# Patient Record
Sex: Female | Born: 1990 | Race: Black or African American | Hispanic: No | Marital: Single | Smoking: Never smoker
Health system: Southern US, Community
[De-identification: ages and names within clinical notes are randomized; demographics above are authoritative.]

## PROBLEM LIST (undated history)

## (undated) DIAGNOSIS — D279 Benign neoplasm of unspecified ovary: Secondary | ICD-10-CM

## (undated) DIAGNOSIS — K5000 Crohn's disease of small intestine without complications: Secondary | ICD-10-CM

## (undated) DIAGNOSIS — K2970 Gastritis, unspecified, without bleeding: Secondary | ICD-10-CM

## (undated) HISTORY — PX: ESOPHAGOGASTRODUODENOSCOPY: SHX1529

## (undated) SURGERY — COLONOSCOPY
Anesthesia: Moderate Sedation

---
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
## 2012-08-12 ENCOUNTER — Emergency Department (HOSPITAL_BASED_OUTPATIENT_CLINIC_OR_DEPARTMENT_OTHER): Payer: Managed Care, Other (non HMO)

## 2012-08-12 ENCOUNTER — Emergency Department (HOSPITAL_BASED_OUTPATIENT_CLINIC_OR_DEPARTMENT_OTHER)
Admission: EM | Admit: 2012-08-12 | Discharge: 2012-08-12 | Disposition: A | Discharge status: Home / Self Care | Payer: Managed Care, Other (non HMO) | Attending: Emergency Medicine | Admitting: Emergency Medicine

## 2012-08-12 ENCOUNTER — Encounter (HOSPITAL_BASED_OUTPATIENT_CLINIC_OR_DEPARTMENT_OTHER): Payer: Self-pay | Admitting: *Deleted

## 2012-08-12 DIAGNOSIS — R112 Nausea with vomiting, unspecified: Secondary | ICD-10-CM

## 2012-08-12 DIAGNOSIS — R109 Unspecified abdominal pain: Secondary | ICD-10-CM

## 2012-08-12 DIAGNOSIS — Z9089 Acquired absence of other organs: Secondary | ICD-10-CM | POA: Insufficient documentation

## 2012-08-12 DIAGNOSIS — R1013 Epigastric pain: Secondary | ICD-10-CM | POA: Insufficient documentation

## 2012-08-12 LAB — CBC WITH DIFFERENTIAL/PLATELET
Basophils Absolute: 0 10*3/uL (ref 0.0–0.1)
Basophils Relative: 0 % (ref 0–1)
Eosinophils Absolute: 0.4 10*3/uL (ref 0.0–0.7)
Eosinophils Relative: 3 % (ref 0–5)
HCT: 38 % (ref 36.0–46.0)
Hemoglobin: 13 g/dL (ref 12.0–15.0)
Lymphocytes Relative: 14 % (ref 12–46)
Lymphs Abs: 1.7 10*3/uL (ref 0.7–4.0)
MCH: 25.5 pg — ABNORMAL LOW (ref 26.0–34.0)
MCHC: 34.2 g/dL (ref 30.0–36.0)
MCV: 74.5 fL — ABNORMAL LOW (ref 78.0–100.0)
Monocytes Absolute: 0.8 10*3/uL (ref 0.1–1.0)
Monocytes Relative: 7 % (ref 3–12)
Neutro Abs: 9.1 10*3/uL — ABNORMAL HIGH (ref 1.7–7.7)
Neutrophils Relative %: 76 % (ref 43–77)
Platelets: 368 10*3/uL (ref 150–400)
RBC: 5.1 MIL/uL (ref 3.87–5.11)
RDW: 13.2 % (ref 11.5–15.5)
WBC: 12 10*3/uL — ABNORMAL HIGH (ref 4.0–10.5)

## 2012-08-12 LAB — COMPREHENSIVE METABOLIC PANEL
ALT: 29 U/L (ref 0–35)
AST: 20 U/L (ref 0–37)
Albumin: 3.7 g/dL (ref 3.5–5.2)
Alkaline Phosphatase: 115 U/L (ref 39–117)
BUN: 6 mg/dL (ref 6–23)
CO2: 25 mEq/L (ref 19–32)
Calcium: 9.6 mg/dL (ref 8.4–10.5)
Chloride: 99 mEq/L (ref 96–112)
Creatinine, Ser: 0.7 mg/dL (ref 0.50–1.10)
GFR calc Af Amer: >90 mL/min (ref >90)
GFR calc non Af Amer: >90 mL/min (ref >90)
Glucose, Bld: 89 mg/dL (ref 70–99)
Potassium: 3.5 mEq/L (ref 3.5–5.1)
Sodium: 137 mEq/L (ref 135–145)
Total Bilirubin: 0.5 mg/dL (ref 0.3–1.2)
Total Protein: 8.9 g/dL — ABNORMAL HIGH (ref 6.0–8.3)

## 2012-08-12 LAB — PREGNANCY, URINE: Preg Test, Ur: NEGATIVE

## 2012-08-12 LAB — URINE MICROSCOPIC-ADD ON

## 2012-08-12 LAB — URINALYSIS, ROUTINE W REFLEX MICROSCOPIC
Glucose, UA: NEGATIVE mg/dL
Ketones, ur: 15 mg/dL — AB
Leukocytes, UA: NEGATIVE
Nitrite: NEGATIVE
Protein, ur: NEGATIVE mg/dL
Specific Gravity, Urine: 1.024 (ref 1.005–1.030)
Urobilinogen, UA: 1 mg/dL (ref 0.0–1.0)
pH: 6.5 (ref 5.0–8.0)

## 2012-08-12 LAB — LIPASE, BLOOD: Lipase: 20 U/L (ref 11–59)

## 2012-08-12 MED ORDER — ONDANSETRON HCL 4 MG/2ML IJ SOLN
4.0000 mg | Freq: Once | INTRAMUSCULAR | Status: AC
  Administered 2012-08-12: 4 mg via INTRAVENOUS
  Filled 2012-08-12: qty 2

## 2012-08-12 MED ORDER — PANTOPRAZOLE SODIUM 40 MG IV SOLR
40.0000 mg | Freq: Once | INTRAVENOUS | Status: AC
  Administered 2012-08-12: 40 mg via INTRAVENOUS
  Filled 2012-08-12: qty 40

## 2012-08-12 MED ORDER — ONDANSETRON 8 MG PO TBDP: 8.0000 mg | ORAL_TABLET | Freq: Two times a day (BID) | ORAL | Status: AC | PRN

## 2012-08-12 MED ORDER — FENTANYL CITRATE 0.05 MG/ML IJ SOLN
50.0000 ug | Freq: Once | INTRAMUSCULAR | Status: AC
  Administered 2012-08-12: 50 ug via INTRAVENOUS
  Filled 2012-08-12: qty 2

## 2012-08-12 MED ORDER — ACETAMINOPHEN-CODEINE #3 300-30 MG PO TABS: 1.0000 | ORAL_TABLET | Freq: Four times a day (QID) | ORAL | Status: AC | PRN

## 2012-08-12 MED ORDER — SODIUM CHLORIDE 0.9 % IV BOLUS (SEPSIS)
1000.0000 mL | Freq: Once | INTRAVENOUS | Status: AC
  Administered 2012-08-12: 1000 mL via INTRAVENOUS

## 2012-08-12 MED ORDER — PANTOPRAZOLE SODIUM 40 MG PO TBEC: 40.0000 mg | DELAYED_RELEASE_TABLET | Freq: Every day | ORAL | Status: DC

## 2012-08-12 NOTE — ED Notes (Signed)
Pt c/o abd pain with n/v x 2 days.  

## 2012-08-12 NOTE — ED Provider Notes (Signed)
History     CSN: [id number]  Arrival date & time 08/12/12  1658   First MD Initiated Contact with Patient 08/12/12 1909      Chief Complaint  Patient presents with  . Abdominal Pain    (Consider location/radiation/quality/duration/timing/severity/associated sxs/prior treatment) HPI Comments: Pt with several day history of intermittent upper epigastric burning pain, has had prior cholecystectomy.  Pt reports worse with eating at times, but sometimes occurs on its own.  Has been associated with occasional N/V, no diarrhea.  No fevers, chills, no foreign travel, no obv sick contacts.  Pt denies feeling sig bloated.  No decrease or increase in flatus.  No dysuria, vaginal bleeding or discharge.  No radiation to chest or back.  Has not taken anything in particular for symptoms.  Has a PCP.    The history is provided by the patient.    History reviewed. No pertinent past medical history.  Past Surgical History  Procedure Date  . Cholecystectomy     History reviewed. No pertinent family history.  History  Substance Use Topics  . Smoking status: Never Smoker   . Smokeless tobacco: Not on file  . Alcohol Use: No    OB History    Grav Para Term Preterm Abortions TAB SAB Ect Mult Living                  Review of Systems  Constitutional: Positive for appetite change. Negative for fever and chills.  HENT: Negative for sore throat and trouble swallowing.   Respiratory: Negative for shortness of breath.   Cardiovascular: Negative for chest pain.  Gastrointestinal: Positive for nausea, vomiting and abdominal distention. Negative for diarrhea.  Genitourinary: Negative for dysuria, frequency, flank pain, vaginal bleeding, vaginal discharge and pelvic pain.  Musculoskeletal: Negative for back pain.  All other systems reviewed and are negative.    Allergies  Review of patient's allergies indicates no known allergies.  Home Medications   Current Outpatient Rx  Name Route Sig  Dispense Refill  . DESOGESTREL-ETHINYL ESTRADIOL 0.15-30 MG-MCG PO TABS Oral Take 1 tablet by mouth daily.    . ACETAMINOPHEN-CODEINE #3 300-30 MG PO TABS Oral Take 1-2 tablets by mouth every 6 (six) hours as needed for pain. 15 tablet 0  . ONDANSETRON 8 MG PO TBDP Oral Take 1 tablet (8 mg total) by mouth every 12 (twelve) hours as needed for nausea. 20 tablet 0  . PANTOPRAZOLE SODIUM 40 MG PO TBEC Oral Take 1 tablet (40 mg total) by mouth daily. 14 tablet 0    BP 131/92  Pulse 62  Temp 97.9 F (36.6 C) (Oral)  Resp 16  Ht 5\' 8"  (1.727 m)  Wt 178 lb (80.74 kg)  BMI 27.06 kg/m2  SpO2 100%  LMP 07/29/2012  Physical Exam  Nursing note and vitals reviewed. Constitutional: She is oriented to person, place, and time. She appears well-developed and well-nourished. No distress.  HENT:  Head: Normocephalic and atraumatic.  Eyes: No scleral icterus.  Neck: Normal range of motion.  Cardiovascular: Normal rate and regular rhythm.   Pulmonary/Chest: Effort normal. No respiratory distress. She has no wheezes.  Abdominal: Soft. Normal appearance and bowel sounds are normal. She exhibits no distension. There is no tenderness. There is no rebound, no tenderness at McBurney's point and negative Murphy's sign.    Musculoskeletal: Normal range of motion.  Neurological: She is alert and oriented to person, place, and time.  Skin: Skin is warm. No rash noted.    ED  Course  Procedures (including critical care time)  Labs Reviewed  URINALYSIS, ROUTINE W REFLEX MICROSCOPIC - Abnormal; Notable for the following:    Color, Urine AMBER (*)  BIOCHEMICALS MAY BE AFFECTED BY COLOR   Hgb urine dipstick LARGE (*)     Bilirubin Urine SMALL (*)     Ketones, ur 15 (*)     All other components within normal limits  URINE MICROSCOPIC-ADD ON - Abnormal; Notable for the following:    Squamous Epithelial / LPF FEW (*)     Bacteria, UA MANY (*)     All other components within normal limits  CBC WITH  DIFFERENTIAL - Abnormal; Notable for the following:    WBC 12.0 (*)     MCV 74.5 (*)     MCH 25.5 (*)     Neutro Abs 9.1 (*)     All other components within normal limits  COMPREHENSIVE METABOLIC PANEL - Abnormal; Notable for the following:    Total Protein 8.9 (*)     All other components within normal limits  PREGNANCY, URINE  LIPASE, BLOOD   US Abdomen Complete  08/12/2012  *RADIOLOGY REPORT*  Clinical Data:  Intermittent epigastric pain.  Cholecystectomy in February 2012.  ABDOMINAL ULTRASOUND COMPLETE  Comparison:  Abdominal ultrasound 12/22/2010  Findings:  Gallbladder:  Surgically absent.  Common Bile Duct:  Within normal limits in caliber. Measures 3 mm.  Liver: No focal mass lesion identified.  Within normal limits in parenchymal echogenicity.  IVC:  Appears normal.  Pancreas: Although the pancreas is difficult to visualize in its entirety, no focal pancreatic abnormality is identified.  Spleen:  Within normal limits in size and echotexture. Measures 5 cm in sagittal length.  Right kidney:  Normal in size and parenchymal echogenicity.  No evidence of mass or hydronephrosis.  Left kidney:  Normal in size and parenchymal echogenicity.  No evidence of mass or hydronephrosis. Imaging is limited by bowel gas, which partially obscures the kidney.  Abdominal Aorta:  No aneurysm identified.  IMPRESSION:  1. Negative abdominal ultrasound. Slight limitation by bowel gas. 2. Cholecystectomy.   Original Report Authenticated By: Britta Mccreedy, M.D.      1. Abdominal pain   2. Nausea and vomiting       MDM  Pt felt improved after IV meds.  Soft abd.  U/S neg.  Labs ok, pt reassured, urged to take antiemetics, bland diet, PPI.  Follow up with GI if not improving in the next 1-2 weeks.  Pt and family agreeable.  Return instructions provided.       Gavin Pound. Oletta Lamas, MD 08/14/12 [id number]

## 2012-08-29 ENCOUNTER — Encounter (HOSPITAL_COMMUNITY): Payer: Self-pay | Admitting: *Deleted

## 2012-08-29 ENCOUNTER — Emergency Department (HOSPITAL_COMMUNITY)
Admission: EM | Admit: 2012-08-29 | Discharge: 2012-08-29 | Disposition: A | Discharge status: Home / Self Care | Payer: Managed Care, Other (non HMO) | Attending: Emergency Medicine | Admitting: Emergency Medicine

## 2012-08-29 DIAGNOSIS — R1013 Epigastric pain: Secondary | ICD-10-CM | POA: Insufficient documentation

## 2012-08-29 LAB — URINALYSIS, ROUTINE W REFLEX MICROSCOPIC
Bilirubin Urine: NEGATIVE
Glucose, UA: NEGATIVE mg/dL
Hgb urine dipstick: NEGATIVE
Ketones, ur: NEGATIVE mg/dL
Protein, ur: NEGATIVE mg/dL
Urobilinogen, UA: 0.2 mg/dL (ref 0.0–1.0)

## 2012-08-29 LAB — BASIC METABOLIC PANEL
BUN: 6 mg/dL (ref 6–23)
CO2: 23 mEq/L (ref 19–32)
Calcium: 9.4 mg/dL (ref 8.4–10.5)
GFR calc non Af Amer: >90 mL/min (ref >90)
Glucose, Bld: 135 mg/dL — ABNORMAL HIGH (ref 70–99)
Potassium: 3.2 mEq/L — ABNORMAL LOW (ref 3.5–5.1)
Sodium: 137 mEq/L (ref 135–145)

## 2012-08-29 LAB — CBC WITH DIFFERENTIAL/PLATELET
Basophils Relative: 0 % (ref 0–1)
Eosinophils Absolute: 0.4 10*3/uL (ref 0.0–0.7)
Eosinophils Relative: 2 % (ref 0–5)
Hemoglobin: 11.3 g/dL — ABNORMAL LOW (ref 12.0–15.0)
Lymphs Abs: 2.3 10*3/uL (ref 0.7–4.0)
MCH: 25.1 pg — ABNORMAL LOW (ref 26.0–34.0)
MCHC: 33.2 g/dL (ref 30.0–36.0)
MCV: 75.4 fL — ABNORMAL LOW (ref 78.0–100.0)
Monocytes Relative: 8 % (ref 3–12)
Neutrophils Relative %: 78 % — ABNORMAL HIGH (ref 43–77)
RBC: 4.51 MIL/uL (ref 3.87–5.11)

## 2012-08-29 LAB — URINE MICROSCOPIC-ADD ON

## 2012-08-29 MED ORDER — PROMETHAZINE HCL 25 MG PO TABS: 25.0000 mg | ORAL_TABLET | Freq: Four times a day (QID) | ORAL | Status: DC | PRN

## 2012-08-29 MED ORDER — GI COCKTAIL ~~LOC~~
30.0000 mL | Freq: Once | ORAL | Status: AC
  Administered 2012-08-29: 30 mL via ORAL
  Filled 2012-08-29: qty 30

## 2012-08-29 MED ORDER — SODIUM CHLORIDE 0.9 % IV BOLUS (SEPSIS)
1000.0000 mL | Freq: Once | INTRAVENOUS | Status: AC
  Administered 2012-08-29: 1000 mL via INTRAVENOUS

## 2012-08-29 MED ORDER — HYDROMORPHONE HCL PF 1 MG/ML IJ SOLN
1.0000 mg | Freq: Once | INTRAMUSCULAR | Status: AC
  Administered 2012-08-29: 1 mg via INTRAVENOUS
  Filled 2012-08-29: qty 1

## 2012-08-29 MED ORDER — OXYCODONE-ACETAMINOPHEN 5-325 MG PO TABS: 2.0000 | ORAL_TABLET | ORAL | Status: DC | PRN

## 2012-08-29 MED ORDER — ONDANSETRON HCL 4 MG/2ML IJ SOLN
4.0000 mg | Freq: Once | INTRAMUSCULAR | Status: AC
  Administered 2012-08-29: 4 mg via INTRAVENOUS
  Filled 2012-08-29: qty 2

## 2012-08-29 NOTE — ED Notes (Addendum)
C/o abd pain, onset 0300, also nausea, no relief with codeine (old previous Rx), last ate ~ 8hrs ago, last BM sunday (normal), (denies: back pain, nvd, fever, vaginal or urinary sx or bleeding). Pain constant, but intensity fluctuates, rates currently a 4/10, but increases to 10/10, pt mildly restless, parents at [facility].

## 2012-08-29 NOTE — ED Provider Notes (Signed)
Pt well appearing and in no distress Her abdomen is soft and no focal tenderness She admits this is something she has had previously, she has seen GI and she reports she is supposed to have EGD in the future She has no lower abdominal tenderness Labs pending and will require reassessment (heart rate has improved) BP 133/81  Pulse 108  Temp 98.3 F (36.8 C)  Resp 25  SpO2 99%  LMP 07/29/2012   Joya Gaskins, MD 08/29/12 [phone]

## 2012-08-29 NOTE — ED Provider Notes (Signed)
History     CSN: [id number]  Arrival date & time 08/29/12  8295   First MD Initiated Contact with Patient 08/29/12 0617      Chief Complaint  Patient presents with  . Abdominal Pain    (Consider location/radiation/quality/duration/timing/severity/associated sxs/prior treatment) HPI Comments: Patient presents with sudden onset epigastric pain starting 3 hours ago that woke her from sleep. She reports a severe pressure sensation that is constantly and intermittently becomes intolerable. Patient reports taking codeine last night which was the last thing she consumed before feeling abdominal pain. She has taken codeine before without difficulty. She reports a history of epigastric pain prior to this episode, for which she is seeing Sarah Morrison with GI. She admits to associated nausea. She denies fever, vomiting, diarrhea, headache.   Patient is a 21 y.o. female presenting with abdominal pain.  Abdominal Pain The primary symptoms of the illness include abdominal pain and nausea.    History reviewed. No pertinent past medical history.  Past Surgical History  Procedure Date  . Cholecystectomy     No family history on file.  History  Substance Use Topics  . Smoking status: Never Smoker   . Smokeless tobacco: Not on file  . Alcohol Use: No    OB History    Grav Para Term Preterm Abortions TAB SAB Ect Mult Living                  Review of Systems  Gastrointestinal: Positive for nausea and abdominal pain.  All other systems reviewed and are negative.    Allergies  Review of patient's allergies indicates no known allergies.  Home Medications   Current Outpatient Rx  Name Route Sig Dispense Refill  . DESOGESTREL-ETHINYL ESTRADIOL 0.15-30 MG-MCG PO TABS Oral Take 1 tablet by mouth daily.    Marland Kitchen PANTOPRAZOLE SODIUM 40 MG PO TBEC Oral Take 1 tablet (40 mg total) by mouth daily. 14 tablet 0    BP 133/81  Pulse 108  Temp 98.3 F (36.8 C)  Resp 25  SpO2 99%  LMP  07/29/2012  Physical Exam  Nursing note and vitals reviewed. Constitutional: She is oriented to person, place, and time. She appears well-developed and well-nourished. No distress.       Patient appears extremely uncomfortable.   HENT:  Head: Normocephalic and atraumatic.  Eyes: Conjunctivae normal are normal. No scleral icterus.  Neck: Normal range of motion. Neck supple.  Cardiovascular: Regular rhythm.  Exam reveals no gallop and no friction rub.   No murmur heard.      Tachycardic in 130's.  Pulmonary/Chest: Effort normal. No respiratory distress. She has no wheezes. She has no rales. She exhibits no tenderness.  Abdominal: Soft. She exhibits no distension. There is tenderness. There is guarding. There is no rebound.       Tenderness to light palpation of epigastrium.   Musculoskeletal: Normal range of motion.  Neurological: She is alert and oriented to person, place, and time. Coordination normal.  Skin: Skin is warm and dry. She is not diaphoretic.  Psychiatric: She has a normal mood and affect. Her behavior is normal.    ED Course  Procedures (including critical care time)  Labs Reviewed  BASIC METABOLIC PANEL - Abnormal; Notable for the following:    Potassium 3.2 (*)     Glucose, Bld 135 (*)     All other components within normal limits  CBC WITH DIFFERENTIAL - Abnormal; Notable for the following:    WBC 18.6 (*)  Hemoglobin 11.3 (*)     HCT 34.0 (*)     MCV 75.4 (*)     MCH 25.1 (*)     Neutrophils Relative 78 (*)     Neutro Abs 14.5 (*)     Monocytes Absolute 1.5 (*)     All other components within normal limits  URINALYSIS, ROUTINE W REFLEX MICROSCOPIC - Abnormal; Notable for the following:    APPearance HAZY (*)     Leukocytes, UA TRACE (*)     All other components within normal limits  URINE MICROSCOPIC-ADD ON - Abnormal; Notable for the following:    Squamous Epithelial / LPF MANY (*)     Bacteria, UA FEW (*)     All other components within normal  limits  POCT PREGNANCY, URINE   No results found.   1. Epigastric abdominal pain       MDM  6:36 AM Patient seems to be in a significant amount of pain. She will receive 2mg  dilaudid, zofran, and a GI cocktail.   8:12 AM Patient feeling much better. No longer tachycardic. She will try drinking water and eating saltines. If she tolerates PO, she can be discharged. She has a follow up scheduled with GI for an EGD in 1 week.   9:08 AM Patient tolerating PO. She will call Sarah Morrison to move her appointment up. I will discharge her with pain medication and phenergan. She should return with worsening or concerning symptoms.     Emilia Beck, PA-C 09/06/12 0101

## 2012-09-10 NOTE — ED Provider Notes (Signed)
Medical screening examination/treatment/procedure(s) were performed by non-physician practitioner and as supervising physician I was immediately available for consultation/collaboration.  Sunnie Nielsen, MD 09/10/12 2255

## 2012-09-27 ENCOUNTER — Encounter: Payer: Self-pay | Admitting: Gastroenterology

## 2012-10-03 ENCOUNTER — Telehealth: Payer: Self-pay | Admitting: Gastroenterology

## 2012-10-03 NOTE — Telephone Encounter (Signed)
Patient has been seeing Dr. [name] at [facility] and had an EGD at the beginning of the month according to the patient's mother.  They want her to come to [place].  She has abdominal pain and weight loss and has 2 ER admissions for the same in October.  The patient's mother is advised that she will need to get the records from Dr. Marge Duncans office prior to seeing the patient.  She is advised that I will leave the appt for 10/24/12 for now and have the records reviewed by Dr. Russella Dar when they arrive (they request Dr. Russella Dar).  She is advised that he will need to agree to see her as a patient and we will call once the records are here.  She is advised that we will not be able to see her at all without the records.

## 2012-10-04 NOTE — Telephone Encounter (Signed)
Some records received from [facility].  I have called Jerene Dilling from the referring office.  She is to send me EGD and path from Dr. [name] and records from Dr. Chestine Spore from 09.

## 2012-10-05 NOTE — Telephone Encounter (Signed)
Records are on Dr. Ardell Isaacs desk for review

## 2012-10-11 ENCOUNTER — Observation Stay (HOSPITAL_COMMUNITY): Payer: Managed Care, Other (non HMO)

## 2012-10-11 ENCOUNTER — Observation Stay (HOSPITAL_COMMUNITY)
Admission: EM | Admit: 2012-10-11 | Discharge: 2012-10-13 | Disposition: A | Discharge status: Home / Self Care | Payer: Managed Care, Other (non HMO) | Attending: Family Medicine | Admitting: Family Medicine

## 2012-10-11 ENCOUNTER — Encounter (HOSPITAL_COMMUNITY): Payer: Self-pay | Admitting: *Deleted

## 2012-10-11 ENCOUNTER — Emergency Department (HOSPITAL_COMMUNITY): Payer: Managed Care, Other (non HMO)

## 2012-10-11 DIAGNOSIS — K5289 Other specified noninfective gastroenteritis and colitis: Secondary | ICD-10-CM | POA: Insufficient documentation

## 2012-10-11 DIAGNOSIS — K5000 Crohn's disease of small intestine without complications: Secondary | ICD-10-CM

## 2012-10-11 DIAGNOSIS — R1031 Right lower quadrant pain: Principal | ICD-10-CM | POA: Insufficient documentation

## 2012-10-11 DIAGNOSIS — D279 Benign neoplasm of unspecified ovary: Secondary | ICD-10-CM

## 2012-10-11 DIAGNOSIS — IMO0001 Reserved for inherently not codable concepts without codable children: Secondary | ICD-10-CM | POA: Diagnosis present

## 2012-10-11 DIAGNOSIS — R300 Dysuria: Secondary | ICD-10-CM | POA: Insufficient documentation

## 2012-10-11 DIAGNOSIS — K219 Gastro-esophageal reflux disease without esophagitis: Secondary | ICD-10-CM | POA: Insufficient documentation

## 2012-10-11 HISTORY — DX: Benign neoplasm of unspecified ovary: D27.9

## 2012-10-11 HISTORY — DX: Crohn's disease of small intestine without complications: K50.00

## 2012-10-11 HISTORY — DX: Gastritis, unspecified, without bleeding: K29.70

## 2012-10-11 LAB — CBC WITH DIFFERENTIAL/PLATELET
Basophils Absolute: 0 10*3/uL (ref 0.0–0.1)
HCT: 33.2 % — ABNORMAL LOW (ref 36.0–46.0)
Hemoglobin: 11 g/dL — ABNORMAL LOW (ref 12.0–15.0)
Lymphocytes Relative: 13 % (ref 12–46)
Lymphs Abs: 1.6 10*3/uL (ref 0.7–4.0)
MCV: 73.8 fL — ABNORMAL LOW (ref 78.0–100.0)
Monocytes Absolute: 0.6 10*3/uL (ref 0.1–1.0)
Monocytes Relative: 5 % (ref 3–12)
Neutro Abs: 9.8 10*3/uL — ABNORMAL HIGH (ref 1.7–7.7)
RBC: 4.5 MIL/uL (ref 3.87–5.11)
RDW: 12.4 % (ref 11.5–15.5)
WBC: 12.4 10*3/uL — ABNORMAL HIGH (ref 4.0–10.5)

## 2012-10-11 LAB — COMPREHENSIVE METABOLIC PANEL WITH GFR
ALT: 24 U/L (ref 0–35)
AST: 18 U/L (ref 0–37)
Albumin: 2.7 g/dL — ABNORMAL LOW (ref 3.5–5.2)
Alkaline Phosphatase: 126 U/L — ABNORMAL HIGH (ref 39–117)
BUN: 4 mg/dL — ABNORMAL LOW (ref 6–23)
CO2: 23 meq/L (ref 19–32)
Calcium: 9.3 mg/dL (ref 8.4–10.5)
Chloride: 100 meq/L (ref 96–112)
Creatinine, Ser: 0.62 mg/dL (ref 0.50–1.10)
GFR calc Af Amer: >90 mL/min (ref >90)
GFR calc non Af Amer: >90 mL/min (ref >90)
Glucose, Bld: 106 mg/dL — ABNORMAL HIGH (ref 70–99)
Potassium: 3.6 meq/L (ref 3.5–5.1)
Sodium: 137 meq/L (ref 135–145)
Total Bilirubin: 0.4 mg/dL (ref 0.3–1.2)
Total Protein: 8.2 g/dL (ref 6.0–8.3)

## 2012-10-11 LAB — HEPATIC FUNCTION PANEL
ALT: 25 U/L (ref 0–35)
Indirect Bilirubin: 0.3 mg/dL (ref 0.3–0.9)
Total Protein: 7.3 g/dL (ref 6.0–8.3)

## 2012-10-11 LAB — URINALYSIS, ROUTINE W REFLEX MICROSCOPIC
Glucose, UA: NEGATIVE mg/dL
Hgb urine dipstick: NEGATIVE
Ketones, ur: NEGATIVE mg/dL
Protein, ur: NEGATIVE mg/dL

## 2012-10-11 LAB — C-REACTIVE PROTEIN: CRP: 7.1 mg/dL — ABNORMAL HIGH (ref <0.60)

## 2012-10-11 MED ORDER — ONDANSETRON HCL 4 MG/2ML IJ SOLN
4.0000 mg | Freq: Once | INTRAMUSCULAR | Status: AC
  Administered 2012-10-11: 4 mg via INTRAVENOUS
  Filled 2012-10-11: qty 2

## 2012-10-11 MED ORDER — ALUM & MAG HYDROXIDE-SIMETH 200-200-20 MG/5ML PO SUSP: 30.0000 mL | Freq: Four times a day (QID) | ORAL | Status: DC | PRN

## 2012-10-11 MED ORDER — IOHEXOL 300 MG/ML  SOLN
20.0000 mL | INTRAMUSCULAR | Status: AC
  Administered 2012-10-11 (×2): 20 mL via ORAL

## 2012-10-11 MED ORDER — IOHEXOL 300 MG/ML  SOLN
80.0000 mL | Freq: Once | INTRAMUSCULAR | Status: AC | PRN
  Administered 2012-10-11: 80 mL via INTRAVENOUS

## 2012-10-11 MED ORDER — PEG 3350-KCL-NA BICARB-NACL 420 G PO SOLR
4000.0000 mL | Freq: Once | ORAL | Status: DC
  Filled 2012-10-11: qty 4000

## 2012-10-11 MED ORDER — CIPROFLOXACIN IN D5W 400 MG/200ML IV SOLN
400.0000 mg | Freq: Two times a day (BID) | INTRAVENOUS | Status: DC
Start: 2012-10-11 — End: 2012-10-13
  Administered 2012-10-11 – 2012-10-13 (×4): 400 mg via INTRAVENOUS
  Filled 2012-10-11 (×6): qty 200

## 2012-10-11 MED ORDER — DIPHENHYDRAMINE HCL 50 MG/ML IJ SOLN: 12.5000 mg | Freq: Four times a day (QID) | INTRAMUSCULAR | Status: DC | PRN

## 2012-10-11 MED ORDER — LACTATED RINGERS IV BOLUS (SEPSIS): 1000.0000 mL | Freq: Three times a day (TID) | INTRAVENOUS | Status: AC | PRN

## 2012-10-11 MED ORDER — METRONIDAZOLE IN NACL 5-0.79 MG/ML-% IV SOLN
500.0000 mg | Freq: Three times a day (TID) | INTRAVENOUS | Status: DC
  Administered 2012-10-11 – 2012-10-13 (×6): 500 mg via INTRAVENOUS
  Filled 2012-10-11 (×7): qty 100

## 2012-10-11 MED ORDER — SODIUM CHLORIDE 0.9 % IV SOLN
Freq: Once | INTRAVENOUS | Status: AC
  Administered 2012-10-11: 12:00:00 via INTRAVENOUS

## 2012-10-11 MED ORDER — MORPHINE SULFATE 4 MG/ML IJ SOLN
4.0000 mg | Freq: Once | INTRAMUSCULAR | Status: AC
  Administered 2012-10-11: 4 mg via INTRAVENOUS
  Filled 2012-10-11: qty 1

## 2012-10-11 MED ORDER — ONDANSETRON HCL 4 MG PO TABS: 4.0000 mg | ORAL_TABLET | Freq: Four times a day (QID) | ORAL | Status: DC | PRN

## 2012-10-11 MED ORDER — MAGIC MOUTHWASH
15.0000 mL | Freq: Four times a day (QID) | ORAL | Status: DC | PRN
  Filled 2012-10-11: qty 15

## 2012-10-11 MED ORDER — LACTATED RINGERS IV BOLUS (SEPSIS)
1000.0000 mL | Freq: Once | INTRAVENOUS | Status: AC
  Administered 2012-10-11: 1000 mL via INTRAVENOUS

## 2012-10-11 MED ORDER — PANTOPRAZOLE SODIUM 40 MG PO TBEC
40.0000 mg | DELAYED_RELEASE_TABLET | Freq: Every day | ORAL | Status: DC
  Administered 2012-10-11 – 2012-10-13 (×3): 40 mg via ORAL
  Filled 2012-10-11 (×3): qty 1

## 2012-10-11 MED ORDER — SODIUM CHLORIDE 0.9 % IV BOLUS (SEPSIS)
1000.0000 mL | Freq: Once | INTRAVENOUS | Status: AC
  Administered 2012-10-11: 1000 mL via INTRAVENOUS

## 2012-10-11 MED ORDER — ONDANSETRON HCL 4 MG/2ML IJ SOLN: 4.0000 mg | Freq: Four times a day (QID) | INTRAMUSCULAR | Status: DC | PRN

## 2012-10-11 MED ORDER — LIP MEDEX EX OINT
1.0000 "application " | TOPICAL_OINTMENT | Freq: Two times a day (BID) | CUTANEOUS | Status: DC
  Filled 2012-10-11: qty 7

## 2012-10-11 MED ORDER — SODIUM CHLORIDE 0.9 % IV SOLN
INTRAVENOUS | Status: DC
  Administered 2012-10-11 – 2012-10-12 (×2): via INTRAVENOUS

## 2012-10-11 MED ORDER — HYDROMORPHONE HCL PF 1 MG/ML IJ SOLN
0.5000 mg | Freq: Once | INTRAMUSCULAR | Status: AC
  Administered 2012-10-11: 0.5 mg via INTRAVENOUS
  Filled 2012-10-11: qty 1

## 2012-10-11 MED ORDER — BLISTEX EX OINT
TOPICAL_OINTMENT | Freq: Two times a day (BID) | CUTANEOUS | Status: DC
  Administered 2012-10-11 – 2012-10-12 (×3): via TOPICAL
  Administered 2012-10-13: 1 via TOPICAL
  Filled 2012-10-11: qty 10

## 2012-10-11 MED ORDER — PEG-KCL-NACL-NASULF-NA ASC-C 100 G PO SOLR
1.0000 | Freq: Once | ORAL | Status: AC
  Administered 2012-10-11: 100 g via ORAL
  Filled 2012-10-11: qty 1

## 2012-10-11 MED ORDER — METHYLPREDNISOLONE SODIUM SUCC 40 MG IJ SOLR
20.0000 mg | Freq: Two times a day (BID) | INTRAMUSCULAR | Status: DC
  Administered 2012-10-11 – 2012-10-13 (×4): 20 mg via INTRAVENOUS
  Filled 2012-10-11 (×6): qty 0.5

## 2012-10-11 MED ORDER — MORPHINE SULFATE 2 MG/ML IJ SOLN
1.0000 mg | INTRAMUSCULAR | Status: DC | PRN
  Administered 2012-10-11: 1 mg via INTRAVENOUS
  Filled 2012-10-11 (×2): qty 1

## 2012-10-11 MED ORDER — SODIUM CHLORIDE 0.9 % IV SOLN: INTRAVENOUS | Status: DC

## 2012-10-11 MED ORDER — METRONIDAZOLE IN NACL 5-0.79 MG/ML-% IV SOLN
500.0000 mg | Freq: Once | INTRAVENOUS | Status: AC
  Administered 2012-10-11: 500 mg via INTRAVENOUS
  Filled 2012-10-11: qty 100

## 2012-10-11 MED ORDER — OXYCODONE HCL 5 MG PO TABS
5.0000 mg | ORAL_TABLET | ORAL | Status: DC | PRN
  Administered 2012-10-11 – 2012-10-12 (×2): 5 mg via ORAL
  Filled 2012-10-11 (×2): qty 1

## 2012-10-11 MED ORDER — CIPROFLOXACIN IN D5W 400 MG/200ML IV SOLN: 400.0000 mg | Freq: Once | INTRAVENOUS | Status: DC

## 2012-10-11 NOTE — Telephone Encounter (Signed)
Dr. Russella Dar please review the records in your in box and advise if ok to keep the appt on 10/24/12

## 2012-10-11 NOTE — Consult Note (Signed)
Sarah Morrison 15-Oct-1991  [id number].   Primary Care MD: Dr. [name]  Requesting MD: Dr. Susy Frizzle Chief Complaint/Reason for Consult: thickening of terminal ileum HPI: This is a very sweet 21 yo female who has been having abdominal pain, nausea, and vomiting since August of this year.  She has been losing weight secondary to an inability to keep down food.  She has had several episodes prior to August as well.  She has been seen by [facility], Dr. Charlott Rakes, and an EGD was performed that revealed gastritis.  She has an appointment with Dr. Jamey Ripa in 2 weeks as well for this abdominal pain.  Last night apparently her pain significantly worsened.  She admits to occasional chills and fevers up to 102.  Upon arrival to [facility], she had a CT scan that reveals "Marked inflammatory findings in the terminal ileum and adjacent cecum, with enlarged pericecal lymph nodes.  The appearance favors Crohn's disease/terminal ileitis, with infectious enterocolitis less likely given the segmental involvement.  No definite extraluminal gas although there is mild complex ascites in the pelvis."  The appendix is obscured, but this CT appearance is not c/w appendicitis.  We have been asked to see the patient.   Review of Systems:  Please see HPI, otherwise all other systems are negative.  FH: Unknown family history as patient is adopted.  Past Medical History  Diagnosis Date  . Gastritis     Past Surgical History  Procedure Date  . Cholecystectomy     Social History:  reports that she has never smoked. She does not have any smokeless tobacco history on file. She reports that she does not drink alcohol or use illicit drugs.  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  Allergies: No Known Allergies   (Not in a hospital admission)  Blood pressure 117/71, pulse 107, temperature 99.9 F (37.7 C), temperature source Oral, resp. rate 16, height 5\' 8"  (1.727 m), weight 165 lb  (74.844 kg), last menstrual period 09/10/2012, SpO2 99.00%. Physical Exam: General: pleasant, WD, WN, black female who is laying in bed in NAD HEENT: head is normocephalic, atraumatic.  Sclera are noninjected.  PERRL.  Ears and nose without any masses or lesions.  Mouth is pink and moist Heart: regular, rate, and rhythm.  Normal s1,s2. No obvious murmurs, gallops, or rubs noted.  Palpable radial and pedal pulses bilaterally Lungs: CTAB, no wheezes, rhonchi, or rales noted.  Respiratory effort nonlabored Abd: soft, tender focally in the RLQ, ND, +BS, no masses, hernias, or organomegaly, no guarding or peritoneal signs MS: all 4 extremities are symmetrical with no cyanosis, clubbing, or edema. Skin: warm and dry with no masses, lesions, or rashes Psych: A&Ox3 with an appropriate affect.    Results for orders placed during the hospital encounter of 10/11/12 (from the past 48 hour(s))  CBC WITH DIFFERENTIAL     Status: Abnormal   Collection Time   10/11/12  4:15 AM      Component Value Range Comment   WBC 12.4 (*) 4.0 - 10.5 K/uL    RBC 4.50  3.87 - 5.11 MIL/uL    Hemoglobin 11.0 (*) 12.0 - 15.0 g/dL    HCT 78.2 (*) 95.6 - 46.0 %    MCV 73.8 (*) 78.0 - 100.0 fL    MCH 24.4 (*) 26.0 - 34.0 pg    MCHC 33.1  30.0 - 36.0 g/dL    RDW 21.3  08.6 - 57.8 %    Platelets  502 (*) 150 - 400 K/uL    Neutrophils Relative 79 (*) 43 - 77 %    Neutro Abs 9.8 (*) 1.7 - 7.7 K/uL    Lymphocytes Relative 13  12 - 46 %    Lymphs Abs 1.6  0.7 - 4.0 K/uL    Monocytes Relative 5  3 - 12 %    Monocytes Absolute 0.6  0.1 - 1.0 K/uL    Eosinophils Relative 3  0 - 5 %    Eosinophils Absolute 0.4  0.0 - 0.7 K/uL    Basophils Relative 0  0 - 1 %    Basophils Absolute 0.0  0.0 - 0.1 K/uL   COMPREHENSIVE METABOLIC PANEL     Status: Abnormal   Collection Time   10/11/12  4:15 AM      Component Value Range Comment   Sodium 137  135 - 145 mEq/L    Potassium 3.6  3.5 - 5.1 mEq/L    Chloride 100  96 - 112 mEq/L     CO2 23  19 - 32 mEq/L    Glucose, Bld 106 (*) 70 - 99 mg/dL    BUN 4 (*) 6 - 23 mg/dL    Creatinine, Ser 3.08  0.50 - 1.10 mg/dL    Calcium 9.3  8.4 - 65.7 mg/dL    Total Protein 8.2  6.0 - 8.3 g/dL    Albumin 2.7 (*) 3.5 - 5.2 g/dL    AST 18  0 - 37 U/L    ALT 24  0 - 35 U/L    Alkaline Phosphatase 126 (*) 39 - 117 U/L    Total Bilirubin 0.4  0.3 - 1.2 mg/dL    GFR calc non Af Amer >90  >90 mL/min    GFR calc Af Amer >90  >90 mL/min   LIPASE, BLOOD     Status: Normal   Collection Time   10/11/12  4:15 AM      Component Value Range Comment   Lipase 24  11 - 59 U/L   URINALYSIS, ROUTINE W REFLEX MICROSCOPIC     Status: Normal   Collection Time   10/11/12  4:40 AM      Component Value Range Comment   Color, Urine YELLOW  YELLOW    APPearance CLEAR  CLEAR    Specific Gravity, Urine 1.009  1.005 - 1.030    pH 8.0  5.0 - 8.0    Glucose, UA NEGATIVE  NEGATIVE mg/dL    Hgb urine dipstick NEGATIVE  NEGATIVE    Bilirubin Urine NEGATIVE  NEGATIVE    Ketones, ur NEGATIVE  NEGATIVE mg/dL    Protein, ur NEGATIVE  NEGATIVE mg/dL    Urobilinogen, UA 1.0  0.0 - 1.0 mg/dL    Nitrite NEGATIVE  NEGATIVE    Leukocytes, UA NEGATIVE  NEGATIVE MICROSCOPIC NOT DONE ON URINES WITH NEGATIVE PROTEIN, BLOOD, LEUKOCYTES, NITRITE, OR GLUCOSE <1000 mg/dL.  POCT PREGNANCY, URINE     Status: Normal   Collection Time   10/11/12  4:47 AM      Component Value Range Comment   Preg Test, Ur NEGATIVE  NEGATIVE    Ct Abdomen Pelvis W Contrast  10/11/2012  *RADIOLOGY REPORT*  Clinical Data: Abdominal pain.  Emesis.  CT ABDOMEN AND PELVIS WITH CONTRAST  Technique:  Multidetector CT imaging of the abdomen and pelvis was performed following the standard protocol during bolus administration of intravenous contrast.  Contrast: 80mL OMNIPAQUE IOHEXOL 300 MG/ML  SOLN  Comparison: 08/12/2012  Findings: Focal fatty infiltration in segment 4B of the liver.  Spleen, pancreas, and adrenal glands unremarkable.  Gallbladder  surgically absent.  No pathologic retroperitoneal or porta hepatis adenopathy is identified.  The kidneys appear unremarkable, as do the proximal ureters.  Marked wall thickening of the terminal ileum and adjacent cecum noted, with adjacent free fluid and a small but abnormal amount of complex pelvic ascites.  Mildly prominent pericecal lymph nodes are present.  The appendix is not readily seen.  Orally administered contrast is present in the stomach and the proximal and mid small bowel.  5.3 x 6.1 x 4.3 cm left ovarian dermoid noted with fatty and calcific elements.  Uterus unremarkable.  Right ovary mildly enlarged but indistinctly marginated due to adjacent inflammatory findings.  Sacroiliac joints unremarkable.  IMPRESSION:  1.  Marked inflammatory findings in the terminal ileum and adjacent cecum, with enlarged pericecal lymph nodes.  The appearance favors Crohn's disease/terminal ileitis, with infectious enterocolitis less likely given the segmental involvement.  No definite extraluminal gas although there is mild complex ascites in the pelvis. 2.  The appendix is completely obscured.  Although the epicenter of the inflammatory process appears to be in the terminal ileum and cecum, this makes appendicitis difficult to completely exclude. 3.  Left ovarian dermoid. 4.  Right ovary is obscured by surrounding inflammatory findings. Given the complex ascites, pelvic sonography to rule out the unlikely possibility of concomitant right ovarian torsion may be warranted.   Original Report Authenticated By: Gaylyn Rong, M.D.        Assessment/Plan 1. Terminal ileitis, likely Crohn's disease  Plan: 1. Currently, the patient does not have any evidence of a bowel obstruction, perforation, fistula, or abscess.  I would recommend a GI evaluation by [facility] (since they've seen her in the past) for possible c-scope to get a tissue biopsy for diagnosis.  There is nothing surgical to do at this point.  I have  called our office and cancelled her appointment with Dr. Jamey Ripa given her current situation.  Please call us if we can be of further assistance.  Thank you for this consultation.  Dlynn Ranes E 10/11/2012, 12:22 PM Pager: [phone]

## 2012-10-11 NOTE — ED Provider Notes (Signed)
21 year old female with a history of lower abdominal pain since August who presents with focal right lower quadrant pain over the last 24 hours. This pain is gradually worsening, not associated with fevers, vaginal bleeding or vaginal discharge and has been persistent since onset. It is distinctly different than the pain she has had over the last 2 months for which he has had workup with other imaging modalities other than CT scan. Her white blood cell count is over 12,000, her abdomen is focally tender in the right lower quadrant with guarding at McBurney's point, she has a positive Rovsing sign but otherwise has a soft and non-peritoneal abdomen. We'll pursue CT scan to rule out appendicitis.  Medical screening examination/treatment/procedure(s) were conducted as a shared visit with non-physician practitioner(s) and myself.  I personally evaluated the patient during the encounter    Vida Roller, MD 10/11/12 [phone]

## 2012-10-11 NOTE — Telephone Encounter (Signed)
I have reviewed records. She has seen Dr. Bing Plume and Dr. Doy Mince with no clear cause of pain identified. Mild gastritis on EGD not likely the cause. 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 I am not sure I have anything else to offer her but can see her as scheduled.

## 2012-10-11 NOTE — ED Provider Notes (Signed)
Medical screening examination/treatment/procedure(s) were performed by non-physician practitioner and as supervising physician I was immediately available for consultation/collaboration.   Charles B. Bernette Mayers, MD 10/11/12 1022

## 2012-10-11 NOTE — ED Provider Notes (Signed)
Pt handed off to me by Sabino Dick, NP. She is awaiting CT scan of the abdomen to evaluate for RLQ pain.   Results for orders placed during the hospital encounter of 10/11/12  CBC WITH DIFFERENTIAL      Component Value Range   WBC 12.4 (*) 4.0 - 10.5 K/uL   RBC 4.50  3.87 - 5.11 MIL/uL   Hemoglobin 11.0 (*) 12.0 - 15.0 g/dL   HCT 16.1 (*) 09.6 - 04.5 %   MCV 73.8 (*) 78.0 - 100.0 fL   MCH 24.4 (*) 26.0 - 34.0 pg   MCHC 33.1  30.0 - 36.0 g/dL   RDW 40.9  81.1 - 91.4 %   Platelets 502 (*) 150 - 400 K/uL   Neutrophils Relative 79 (*) 43 - 77 %   Neutro Abs 9.8 (*) 1.7 - 7.7 K/uL   Lymphocytes Relative 13  12 - 46 %   Lymphs Abs 1.6  0.7 - 4.0 K/uL   Monocytes Relative 5  3 - 12 %   Monocytes Absolute 0.6  0.1 - 1.0 K/uL   Eosinophils Relative 3  0 - 5 %   Eosinophils Absolute 0.4  0.0 - 0.7 K/uL   Basophils Relative 0  0 - 1 %   Basophils Absolute 0.0  0.0 - 0.1 K/uL  COMPREHENSIVE METABOLIC PANEL      Component Value Range   Sodium 137  135 - 145 mEq/L   Potassium 3.6  3.5 - 5.1 mEq/L   Chloride 100  96 - 112 mEq/L   CO2 23  19 - 32 mEq/L   Glucose, Bld 106 (*) 70 - 99 mg/dL   BUN 4 (*) 6 - 23 mg/dL   Creatinine, Ser 7.82  0.50 - 1.10 mg/dL   Calcium 9.3  8.4 - 95.6 mg/dL   Total Protein 8.2  6.0 - 8.3 g/dL   Albumin 2.7 (*) 3.5 - 5.2 g/dL   AST 18  0 - 37 U/L   ALT 24  0 - 35 U/L   Alkaline Phosphatase 126 (*) 39 - 117 U/L   Total Bilirubin 0.4  0.3 - 1.2 mg/dL   GFR calc non Af Amer >90  >90 mL/min   GFR calc Af Amer >90  >90 mL/min  LIPASE, BLOOD      Component Value Range   Lipase 24  11 - 59 U/L  URINALYSIS, ROUTINE W REFLEX MICROSCOPIC      Component Value Range   Color, Urine YELLOW  YELLOW   APPearance CLEAR  CLEAR   Specific Gravity, Urine 1.009  1.005 - 1.030   pH 8.0  5.0 - 8.0   Glucose, UA NEGATIVE  NEGATIVE mg/dL   Hgb urine dipstick NEGATIVE  NEGATIVE   Bilirubin Urine NEGATIVE  NEGATIVE   Ketones, ur NEGATIVE  NEGATIVE mg/dL   Protein, ur NEGATIVE   NEGATIVE mg/dL   Urobilinogen, UA 1.0  0.0 - 1.0 mg/dL   Nitrite NEGATIVE  NEGATIVE   Leukocytes, UA NEGATIVE  NEGATIVE  POCT PREGNANCY, URINE      Component Value Range   Preg Test, Ur NEGATIVE  NEGATIVE   Ct Abdomen Pelvis W Contrast  10/11/2012  *RADIOLOGY REPORT*  Clinical Data: Abdominal pain.  Emesis.  CT ABDOMEN AND PELVIS WITH CONTRAST  Technique:  Multidetector CT imaging of the abdomen and pelvis was performed following the standard protocol during bolus administration of intravenous contrast.  Contrast: 80mL OMNIPAQUE IOHEXOL 300 MG/ML  SOLN  Comparison: 08/12/2012  Findings: Focal fatty infiltration in segment 4B of the liver.  Spleen, pancreas, and adrenal glands unremarkable.  Gallbladder surgically absent.  No pathologic retroperitoneal or porta hepatis adenopathy is identified.  The kidneys appear unremarkable, as do the proximal ureters.  Marked wall thickening of the terminal ileum and adjacent cecum noted, with adjacent free fluid and a small but abnormal amount of complex pelvic ascites.  Mildly prominent pericecal lymph nodes are present.  The appendix is not readily seen.  Orally administered contrast is present in the stomach and the proximal and mid small bowel.  5.3 x 6.1 x 4.3 cm left ovarian dermoid noted with fatty and calcific elements.  Uterus unremarkable.  Right ovary mildly enlarged but indistinctly marginated due to adjacent inflammatory findings.  Sacroiliac joints unremarkable.  IMPRESSION:  1.  Marked inflammatory findings in the terminal ileum and adjacent cecum, with enlarged pericecal lymph nodes.  The appearance favors Crohn's disease/terminal ileitis, with infectious enterocolitis less likely given the segmental involvement.  No definite extraluminal gas although there is mild complex ascites in the pelvis. 2.  The appendix is completely obscured.  Although the epicenter of the inflammatory process appears to be in the terminal ileum and cecum, this makes  appendicitis difficult to completely exclude. 3.  Left ovarian dermoid. 4.  Right ovary is obscured by surrounding inflammatory findings. Given the complex ascites, pelvic sonography to rule out the unlikely possibility of concomitant right ovarian torsion may be warranted.   Original Report Authenticated By: Gaylyn Rong, M.D.       PT continues to have RLQ pain and nausea. Because the appendix can not be seen at all, General Surgery Consulted.  They have agreed to come see patient and I will await their evaluation for further management.   Dorthula Matas, PA 10/11/12 [phone]

## 2012-10-11 NOTE — ED Notes (Signed)
Pt with hx of RLQ pain since Aug that has been dx as gastritis to ED c/o acute increase in pain last night around 11.  Pt with emesis x 1.  Denies diarrhea, but states constipation (pt on milk of mag).  Today last bm.  Denies vag discharge.  States pain when urinating.

## 2012-10-11 NOTE — Consult Note (Signed)
Chart was reviewed and patient was examined. X-rays were reviewed.   Clinical scenario and x-ray findings are compatible with inflammatory bowel disease. Appendicitis is less likely since symptoms have been chronic. Proceed with recommendations as per NP note.  Barbette Hair. Arlyce Dice, M.D., [facility] Cell [phone]

## 2012-10-11 NOTE — ED Provider Notes (Signed)
History     CSN: [id number]  Arrival date & time 10/11/12  2952   First MD Initiated Contact with Patient 10/11/12 [phone]      Chief Complaint  Patient presents with  . Abdominal Pain  . Emesis    (Consider location/radiation/quality/duration/timing/severity/associated sxs/prior treatment) HPI Comments: Patient has Hx chronic RLQ pain, has  cholecystectomy, endoscopy with Dx of gastritis and chronic constipation. Now with RLQ pain took laxative and had 1 BM in 2 weeks LMP 3 weeks ago denied vaginal discharge but states dysuria  Patient is a 21 y.o. female presenting with abdominal pain and vomiting. The history is provided by the patient.  Abdominal Pain The primary symptoms of the illness include abdominal pain, vomiting and dysuria. The primary symptoms of the illness do not include fever, nausea, diarrhea, vaginal discharge or vaginal bleeding. The problem has not changed since onset. The dysuria is not associated with frequency or vaginal pain.   Additional symptoms associated with the illness include constipation. Symptoms associated with the illness do not include chills or frequency.  Emesis  Associated symptoms include abdominal pain. Pertinent negatives include no chills, no diarrhea and no fever.    Past Medical History  Diagnosis Date  . Gastritis   . 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 Ileitis, probable Crohn's disease 10/11/2012    CT 10/11/2012: Marked inflammatory findings in the terminal ileum and adjacent  cecum, with enlarged pericecal lymph nodes. The appearance favors  Crohn's disease/terminal ileitis, with infectious enterocolitis  less likely given the segmental involvement. No definite  extraluminal gas although there is mild complex ascites in the  pelvis.      Past Surgical History  Procedure Date  . 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 Esophagogastroduodenoscopy   . Colonoscopy  10/12/2012    Procedure: COLONOSCOPY;  Surgeon: Louis Meckel, MD;  Location[facility]COPY;  Service: Endoscopy;  Laterality: N/A;    History reviewed. No pertinent family history.  History  Substance Use Topics  . Smoking status: Never Smoker   . Smokeless tobacco: Not on file  . Alcohol Use: No    OB History    Grav Para Term Preterm Abortions TAB SAB Ect Mult Living                  Review of Systems  Constitutional: Negative for fever and chills.  Gastrointestinal: Positive for vomiting, abdominal pain and constipation. Negative for nausea and diarrhea.  Genitourinary: Positive for dysuria. Negative for frequency, vaginal bleeding, vaginal discharge and vaginal pain.  Skin: Negative for rash and wound.  Neurological: Negative for dizziness and weakness.    Allergies  Review of patient's allergies indicates no known allergies.  Home Medications   Current Outpatient Rx  Name  Route  Sig  Dispense  Refill  . DIPHENHYDRAMINE HCL (SLEEP) 25 MG PO CAPS   Oral   Take 2 tablets by mouth at bedtime as needed. For sleep         . PANTOPRAZOLE SODIUM 40 MG PO TBEC   Oral   Take 40 mg by mouth daily.         Marland Kitchen MESALAMINE 1.2 G PO TBEC   Oral   Take 2 tablets (2.4 g total) by mouth daily with breakfast.   30 tablet   1   . METRONIDAZOLE 500 MG PO TABS   Oral   Take 1 tablet (500 mg total)  by mouth 3 (three) times daily.   42 tablet   0   . OXYCODONE HCL 5 MG PO TABS   Oral   Take 1 tablet (5 mg total) by mouth every 4 (four) hours as needed.   15 tablet   0   . PREDNISONE (PAK) 10 MG PO TABS   Oral   Take 2 tablets (20 mg total) by mouth daily.   90 tablet   0     BP 121/76  Pulse 82  Temp 97.7 F (36.5 C) (Oral)  Resp 12  Ht 5\' 8"  (1.727 m)  Wt 164 lb 9.6 oz (74.662 kg)  BMI 25.03 kg/m2  SpO2 100%  LMP 09/10/2012  Physical Exam  Constitutional: She is oriented to person, place, and time. She appears well-developed and well-nourished.    HENT:  Head: Normocephalic.  Eyes: Pupils are equal, round, and reactive to light.  Neck: Normal range of motion.  Cardiovascular: Tachycardia present.   Pulmonary/Chest: Effort normal.  Abdominal: Soft. She exhibits no distension. There is tenderness. There is guarding. There is no rebound.  Musculoskeletal: Normal range of motion.  Neurological: She is alert and oriented to person, place, and time.  Skin: Skin is warm. No rash noted. No erythema.    ED Course  Procedures (including critical care time)  Labs Reviewed  CBC WITH DIFFERENTIAL - Abnormal; Notable for the following:    WBC 12.4 (*)     Hemoglobin 11.0 (*)     HCT 33.2 (*)     MCV 73.8 (*)     MCH 24.4 (*)     Platelets 502 (*)     Neutrophils Relative 79 (*)     Neutro Abs 9.8 (*)     All other components within normal limits  COMPREHENSIVE METABOLIC PANEL - Abnormal; Notable for the following:    Glucose, Bld 106 (*)     BUN 4 (*)     Albumin 2.7 (*)     Alkaline Phosphatase 126 (*)     All other components within normal limits  IRON - Abnormal; Notable for the following:    Iron 19 (*)     All other components within normal limits  C-REACTIVE PROTEIN - Abnormal; Notable for the following:    CRP 7.1 (*)     All other components within normal limits  HEPATIC FUNCTION PANEL - Abnormal; Notable for the following:    Albumin 2.4 (*)     All other components within normal limits  CBC - Abnormal; Notable for the following:    Hemoglobin 10.0 (*)     HCT 30.9 (*)     MCV 75.4 (*)     MCH 24.4 (*)     Platelets 441 (*)     All other components within normal limits  BASIC METABOLIC PANEL - Abnormal; Notable for the following:    BUN 5 (*)     All other components within normal limits  LIPASE, BLOOD  URINALYSIS, ROUTINE W REFLEX MICROSCOPIC  POCT PREGNANCY, URINE  VITAMIN B12  PROTIME-INR  SURGICAL PATHOLOGY  LAB REPORT - SCANNED   No results found.   1. Crohn's ileitis   2. Reflux   3. Ileitis,  probable Crohn's disease   4. 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

## 2012-10-11 NOTE — Consult Note (Signed)
[facility] CONSULT Reason for consult: Abnormal CT Referring Physician: [name]. PCP: Dr. [name]. Primary GI: Dr. [name] is an 21 y.o. female.  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 she was seen recently by Dr. [name] because of nausea and vomiting that started several months ago. She been unable to keep down any food. EGD several weeks ago by Dr. [name] reveal slight gastritis. The patient is postcholecystectomy one year ago. She has continued to have abdominal pain and make appointments with surgeon and another GI doctor for other opinions. Her pain became more severe causing her to go to the emergency room. CT scan showed marked inflammatory changes in the terminal ileum and around the cecum with enlarged lymph nodes. This was interpreted as probable Crohn's disease. Urinalysis, lipase, and liver test were normal other than low albumin of 2.7. There was a question about the right ovary as well as the fact the appendix was not well seen. The patient was evaluated by surgery who did not feel that she had appendicitis. The patient has had no chronic diarrhea. In fact she has been constipated and had to take milk of magnesia 2 or 3 days ago and apparently had a good bowel movement following the milk of magnesia. She's had no bowel movement today. Ultrasound of the pelvis showed a 5 cm left adnexal cyst consistent with dermoid cyst and a fairly normal right ovary with no sign of ectopic pregnancy or ovarian torsion. The patient reports that she has never had diarrhea and her main symptoms have been nausea pain and inability to eat large amounts of food. She is adopted and has not aware of any family history.  Past Medical History  Diagnosis Date  . Gastritis   . 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 Ileitis, probable Crohn's disease 10/11/2012    CT 10/11/2012: Marked inflammatory findings in the terminal ileum and  adjacent  cecum, with enlarged pericecal lymph nodes. The appearance favors  Crohn's disease/terminal ileitis, with infectious enterocolitis  less likely given the segmental involvement. No definite  extraluminal gas although there is mild complex ascites in the  pelvis.      Past Surgical History  Procedure Date  . 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  Social History:  reports that she has never smoked. She does not have any smokeless tobacco history on file. She reports that she does not drink alcohol or use illicit drugs.  Allergies: No Known Allergies  Medications;    . [COMPLETED] sodium chloride   Intravenous Once  . ciprofloxacin  400 mg Intravenous BID  . [COMPLETED]  HYDROmorphone (DILAUDID) injection  0.5 mg Intravenous Once  . [COMPLETED] iohexol  20 mL Oral Q1 Hr x 2  . lactated ringers  1,000 mL Intravenous Once  . lip balm   Topical BID  . [COMPLETED] metronidazole  500 mg Intravenous Once  . metronidazole  500 mg Intravenous Q8H  . [COMPLETED]  morphine injection  4 mg Intravenous Once  . [COMPLETED] ondansetron  4 mg Intravenous Once  . [COMPLETED] ondansetron  4 mg Intravenous Once  . pantoprazole  40 mg Oral Daily  . [COMPLETED] sodium chloride  1,000 mL Intravenous Once  . [DISCONTINUED] ciprofloxacin  400 mg Intravenous Once  . [DISCONTINUED] lip balm  1 application Topical BID   PRN Meds alum & mag  hydroxide-simeth, diphenhydrAMINE, [COMPLETED] iohexol, lactated ringers, magic mouthwash, morphine injection, ondansetron (ZOFRAN) IV, ondansetron, oxyCODONE Results for orders placed during the hospital encounter of 10/11/12 (from the past 48 hour(s))  CBC WITH DIFFERENTIAL     Status: Abnormal   Collection Time   10/11/12  4:15 AM      Component Value Range Comment   WBC 12.4 (*) 4.0 - 10.5 K/uL    RBC 4.50  3.87 - 5.11 MIL/uL    Hemoglobin 11.0 (*) 12.0 - 15.0 g/dL    HCT 62.1  (*) 30.8 - 46.0 %    MCV 73.8 (*) 78.0 - 100.0 fL    MCH 24.4 (*) 26.0 - 34.0 pg    MCHC 33.1  30.0 - 36.0 g/dL    RDW 65.7  84.6 - 96.2 %    Platelets 502 (*) 150 - 400 K/uL    Neutrophils Relative 79 (*) 43 - 77 %    Neutro Abs 9.8 (*) 1.7 - 7.7 K/uL    Lymphocytes Relative 13  12 - 46 %    Lymphs Abs 1.6  0.7 - 4.0 K/uL    Monocytes Relative 5  3 - 12 %    Monocytes Absolute 0.6  0.1 - 1.0 K/uL    Eosinophils Relative 3  0 - 5 %    Eosinophils Absolute 0.4  0.0 - 0.7 K/uL    Basophils Relative 0  0 - 1 %    Basophils Absolute 0.0  0.0 - 0.1 K/uL   COMPREHENSIVE METABOLIC PANEL     Status: Abnormal   Collection Time   10/11/12  4:15 AM      Component Value Range Comment   Sodium 137  135 - 145 mEq/L    Potassium 3.6  3.5 - 5.1 mEq/L    Chloride 100  96 - 112 mEq/L    CO2 23  19 - 32 mEq/L    Glucose, Bld 106 (*) 70 - 99 mg/dL    BUN 4 (*) 6 - 23 mg/dL    Creatinine, Ser 9.52  0.50 - 1.10 mg/dL    Calcium 9.3  8.4 - 84.1 mg/dL    Total Protein 8.2  6.0 - 8.3 g/dL    Albumin 2.7 (*) 3.5 - 5.2 g/dL    AST 18  0 - 37 U/L    ALT 24  0 - 35 U/L    Alkaline Phosphatase 126 (*) 39 - 117 U/L    Total Bilirubin 0.4  0.3 - 1.2 mg/dL    GFR calc non Af Amer >90  >90 mL/min    GFR calc Af Amer >90  >90 mL/min   LIPASE, BLOOD     Status: Normal   Collection Time   10/11/12  4:15 AM      Component Value Range Comment   Lipase 24  11 - 59 U/L   URINALYSIS, ROUTINE W REFLEX MICROSCOPIC     Status: Normal   Collection Time   10/11/12  4:40 AM      Component Value Range Comment   Color, Urine YELLOW  YELLOW    APPearance CLEAR  CLEAR    Specific Gravity, Urine 1.009  1.005 - 1.030    pH 8.0  5.0 - 8.0    Glucose, UA NEGATIVE  NEGATIVE mg/dL    Hgb urine dipstick NEGATIVE  NEGATIVE    Bilirubin Urine NEGATIVE  NEGATIVE    Ketones, ur NEGATIVE  NEGATIVE mg/dL    Protein, ur  NEGATIVE  NEGATIVE mg/dL    Urobilinogen, UA 1.0  0.0 - 1.0 mg/dL    Nitrite NEGATIVE  NEGATIVE    Leukocytes,  UA NEGATIVE  NEGATIVE MICROSCOPIC NOT DONE ON URINES WITH NEGATIVE PROTEIN, BLOOD, LEUKOCYTES, NITRITE, OR GLUCOSE <1000 mg/dL.  POCT PREGNANCY, URINE     Status: Normal   Collection Time   10/11/12  4:47 AM      Component Value Range Comment   Preg Test, Ur NEGATIVE  NEGATIVE   HEPATIC FUNCTION PANEL     Status: Abnormal   Collection Time   10/11/12  1:48 PM      Component Value Range Comment   Total Protein 7.3  6.0 - 8.3 g/dL    Albumin 2.4 (*) 3.5 - 5.2 g/dL    AST 21  0 - 37 U/L    ALT 25  0 - 35 U/L    Alkaline Phosphatase 115  39 - 117 U/L    Total Bilirubin 0.4  0.3 - 1.2 mg/dL    Bilirubin, Direct 0.1  0.0 - 0.3 mg/dL    Indirect Bilirubin 0.3  0.3 - 0.9 mg/dL     US Pelvis Complete  10/11/2012  *RADIOLOGY REPORT*  Clinical Data:  Assess for ovarian torsion.  History of Crohn disease.  Left dermoid cyst.  Gravida 0, para 0.  LMP 3 weeks ago.  TRANSABDOMINAL ULTRASOUND OF PELVIS DOPPLER ULTRASOUND OF OVARIES  Technique:  Transabdominal ultrasound examination of the pelvis was performed including evaluation of the uterus, ovaries, adnexal regions, and pelvic cul-de-sac.  Color and duplex Doppler ultrasound was utilized to evaluate blood flow to the ovaries.  Comparison:  CT of the abdomen and pelvis 10/11/2012  Findings:  Uterus:  The uterus is poorly visualized on this transabdominal evaluation, but is estimated to measure 5.8 x 1.9 x 3.5 cm.  Endometrium:  Estimated to measure 3 mm in thickness.  Right ovary:  3.2 x 2.3 x 2.9 cm.  No mass identified on transabdominal evaluation.  Left ovary:  5.6 x 5.3 x 5.3 cm.  Hyperechoic and hypoechoic portions are identified, consistent with dermoid as seen on CT exam.  The entire mass is likely not well seen by ultrasound.  Pulsed Doppler evaluation demonstrates normal low-resistance arterial and venous waveforms in both ovaries.  Additional findings:  There is a small amount of free pelvic fluid.  IMPRESSION:  1.  No evidence for ovarian torsion.  2.  Complex left adnexal mass, consistent with dermoid. 3.  Uterus is poorly visualized on this transabdominal exam.   Original Report Authenticated By: Norva Pavlov, M.D.    Ct Abdomen Pelvis W Contrast  10/11/2012  *RADIOLOGY REPORT*  Clinical Data: Abdominal pain.  Emesis.  CT ABDOMEN AND PELVIS WITH CONTRAST  Technique:  Multidetector CT imaging of the abdomen and pelvis was performed following the standard protocol during bolus administration of intravenous contrast.  Contrast: 80mL OMNIPAQUE IOHEXOL 300 MG/ML  SOLN  Comparison: 08/12/2012  Findings: Focal fatty infiltration in segment 4B of the liver.  Spleen, pancreas, and adrenal glands unremarkable.  Gallbladder surgically absent.  No pathologic retroperitoneal or porta hepatis adenopathy is identified.  The kidneys appear unremarkable, as do the proximal ureters.  Marked wall thickening of the terminal ileum and adjacent cecum noted, with adjacent free fluid and a small but abnormal amount of complex pelvic ascites.  Mildly prominent pericecal lymph nodes are present.  The appendix is not readily seen.  Orally administered contrast is present in the stomach  and the proximal and mid small bowel.  5.3 x 6.1 x 4.3 cm left ovarian dermoid noted with fatty and calcific elements.  Uterus unremarkable.  Right ovary mildly enlarged but indistinctly marginated due to adjacent inflammatory findings.  Sacroiliac joints unremarkable.  IMPRESSION:  1.  Marked inflammatory findings in the terminal ileum and adjacent cecum, with enlarged pericecal lymph nodes.  The appearance favors Crohn's disease/terminal ileitis, with infectious enterocolitis less likely given the segmental involvement.  No definite extraluminal gas although there is mild complex ascites in the pelvis. 2.  The appendix is completely obscured.  Although the epicenter of the inflammatory process appears to be in the terminal ileum and cecum, this makes appendicitis difficult to completely exclude.  3.  Left ovarian dermoid. 4.  Right ovary is obscured by surrounding inflammatory findings. Given the complex ascites, pelvic sonography to rule out the unlikely possibility of concomitant right ovarian torsion may be warranted.   Original Report Authenticated By: Gaylyn Rong, M.D.    ROS: Constitutional: Chronic weakness and weight loss due to nausea after eating. HEENT: Negative Cardiovascular: Negative Respiratory: Negative GI: As above. Patient is postcholecystectomy GU: No dysuria or kidney stones Musculoskeletal: Negative Neuro/Psychiatric: Negative Endocrine/Heme: Negative            Blood pressure 117/79, pulse 112, temperature 98 F (36.7 C), temperature source Oral, resp. rate 18, height 5\' 8"  (1.727 m), weight 74.662 kg (164 lb 9.6 oz), last menstrual period 09/10/2012, SpO2 100.00%.  Physical exam:  Patient examined with the nurse as chaperone Gen.-young African American female in no distress Eyes-sclerae are nonicteric Lungs-clear Heart-regular rate and rhythm without murmurs or gallops Abdomen-nondistended and generally soft with mild right lower quadrant tenderness. No guarding or rigidity no peritoneal signs. Abdomen is not distended and bowel sounds appear normal  Assessment: 1. Abdominal pain. CT suggests inflammatory process of the cecum and terminal ileum. Her physical exam is certainly not consistent with appendicitis. Her history however, as not clearly consistent with Crohn's disease and that she is constipated rather than having diarrhea. I think we need colonoscopy to evaluate this area endoscopically and obtain biopsies. She is not having any diarrhea that would suggest infectious cause  Plan: We will go ahead and give her some enemas this evening and start her on NuLytely prep with 1 quart tonight and additional NuLytely tomorrow. If she tolerates the prep well, we will proceed with colonoscopy tomorrow at 3 PM. Have discussed this with the  patient and her parents. If she is not tolerate the prep, we may need to give additional enemas.   Radley Teston JR,[place]apa [place] L 10/11/2012, 3:27 PM

## 2012-10-11 NOTE — ED Provider Notes (Signed)
9:52 AM BP 125/85  Pulse 103  Temp 99.5 F (37.5 C) (Oral)  Resp 18  Ht 5\' 8"  (1.727 m)  Wt 165 lb (74.844 kg)  BMI 25.09 kg/m2  SpO2 100%  LMP 09/10/2012  Patient here in CDU awaiting surgical consult.  CT scan shows market and inflammatory changes in the right lower quadrant.  Will assume care for nausea, vomiting and abdominal pain control.    CT IMPRESSION: 1. Marked inflammatory findings in the terminal ileum and adjacent cecum, with enlarged pericecal lymph nodes. The appearance favors Crohn's disease/terminal ileitis, with infectious enterocolitis less likely given the segmental involvement. No definite extraluminal gas although there is mild complex ascites in the pelvis. 2. The appendix is completely obscured. Although the epicenter of the inflammatory process appears to be in the terminal ileum and cecum, this makes appendicitis difficult to completely exclude. 3. Left ovarian dermoid. 4. Right ovary is obscured by surrounding inflammatory findings. Given the complex ascites, pelvic sonography to rule out the unlikely possibility of concomitant right ovarian torsion may be warranted. Original Report Authenticated By: Gaylyn Rong, M.D.   Pain and nausea under control.   10:11 AM Spoke with PA Neva Seat.  Will initiate flagyl for infection prophylaxis.   10:48 AM Spoke with Dr, Cena Benton who will admit the patient.  He has asked me to consult GI for consult.  I have discussed the plant with the patient.  11:00 AM SPoke with GI who will consult on the patient.  Arthor Captain, PA-C 10/11/12 1955

## 2012-10-11 NOTE — Progress Notes (Signed)
Was informed patient has been seen by [facility] and is tentatively been scheduled for colonoscopy tomorrow. We will therefore cancel her consult in her colonoscopy orders. Have discussed this with [facility] and with the patient.

## 2012-10-11 NOTE — Consult Note (Signed)
[facility] Consultation  Referring Provider: [name] Primary Care Physician:  Cala Bradford, MD Primary Gastroenterologist:   [facility]. Patient transferring care to [facility], actually has an appointment scheduled with [place]. 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 Patient was evaluated by [facility], she had an upper endoscopy. The patient called our office several days ago, she wanted a second opinion. Her records were reviewed by Dr. Russella Dar, she was given a new patient appointment . EGD by Dr. [name] showed only gastritis. Patient has been taking a PPI at home .  Approximately 3 weeks ago, [name] began having problems with constipation and right lower quadrant. The right lower quadrant pain is worse with meals. Though her upper abdominal pain has improved , patient continues to have frequent nausea and vomiting. She has lost 20 pounds since August. She has had occasional fevers up to 102 over the last 3 weeks. Absolutely no diarrhea. No blood in stool. No skin rashes. No arthralgias.  Over the last 3 weeks patient has developed some urinary symptoms. She describes difficulty emptying her bladder   Past Medical History  Diagnosis Date  . Gastritis     Past Surgical History  Procedure Date  . Cholecystectomy     Prior to Admission medications   Medication Sig Start Date End Date Taking? Authorizing Provider  DiphenhydrAMINE HCl, Sleep, (ZZZQUIL) 25 MG CAPS Take 2 tablets by mouth at bedtime as needed. For sleep   Yes Historical Provider, MD  pantoprazole (PROTONIX) 40 MG tablet Take 40 mg by mouth daily.   Yes Historical Provider, MD  Wheat Dextrin (BENEFIBER PO) Take 5 mLs by mouth daily.   Yes Historical Provider, MD    Current Facility-Administered Medications  Medication Dose Route Frequency Provider  Last Rate Last Dose  . [COMPLETED] 0.9 %  sodium chloride infusion   Intravenous Once Arthor Captain, PA-C 10 mL/hr at 10/11/12 1130    . [COMPLETED] HYDROmorphone (DILAUDID) injection 0.5 mg  0.5 mg Intravenous Once Arman Filter, NP   0.5 mg at 10/11/12 0428  . [COMPLETED] iohexol (OMNIPAQUE) 300 MG/ML solution 20 mL  20 mL Oral Q1 Hr x 2 Medication Radiologist, MD   20 mL at 10/11/12 0700  . [COMPLETED] iohexol (OMNIPAQUE) 300 MG/ML solution 80 mL  80 mL Intravenous Once PRN Medication Radiologist, MD   80 mL at 10/11/12 0828  . [COMPLETED] metroNIDAZOLE (FLAGYL) IVPB 500 mg  500 mg Intravenous Once Arthor Captain, PA-C   500 mg at 10/11/12 1133  . [COMPLETED] morphine 4 MG/ML injection 4 mg  4 mg Intravenous Once Dorthula Matas, PA   4 mg at 10/11/12 0936  . [COMPLETED] ondansetron (ZOFRAN) injection 4 mg  4 mg Intravenous Once Arman Filter, NP   4 mg at 10/11/12 0427  . [COMPLETED] ondansetron (ZOFRAN) injection 4 mg  4 mg Intravenous Once Dorthula Matas, PA   4 mg at 10/11/12 0935  . [COMPLETED] sodium chloride 0.9 % bolus 1,000 mL  1,000 mL Intravenous Once Arman Filter, NP   1,000 mL at 10/11/12 0430  . [DISCONTINUED] ciprofloxacin (CIPRO) IVPB 400 mg  400 mg Intravenous Once Arthor Captain, PA-C       Current Outpatient Prescriptions  Medication Sig Dispense Refill  . DiphenhydrAMINE HCl, Sleep, (ZZZQUIL) 25 MG CAPS Take 2 tablets by mouth  at bedtime as needed. For sleep      . pantoprazole (PROTONIX) 40 MG tablet Take 40 mg by mouth daily.      . Wheat Dextrin (BENEFIBER PO) Take 5 mLs by mouth daily.        Allergies as of 10/11/2012  . (No Known Allergies)    No family history on file.  Patient adopted  History   Social History  . Marital Status: Single    Spouse Name: N/A    Number of Children: N/A  . Years of Education: N/A   Occupational History  . Not on file.   Social History Main Topics  . Smoking status: Never Smoker   . Smokeless tobacco: Not on file    . Alcohol Use: No  . Drug Use: No  . Sexually Active: Yes    Birth Control/ Protection: Pill    Review of Systems: All systems reviewed and negative except where noted in the history of present illness   PHYSICAL EXAM: Vital signs in last 24 hours: Temp:  [98.5 F (36.9 C)-99.9 F (37.7 C)] 99.9 F (37.7 C) (11/05 1127) Pulse Rate:  [97-117] 107  (11/05 1127) Resp:  [16-25] 16  (11/05 1127) BP: (117-137)/(71-85) 117/71 mmHg (11/05 1127) SpO2:  [95 %-100 %] 99 % (11/05 1127) Weight:  [165 lb (74.844 kg)] 165 lb (74.844 kg) (11/05 0401)   General:   Pleasant black female in NAD Head:  Normocephalic and atraumatic. Eyes:   No icterus.   Conjunctiva pink. Ears:  Normal auditory acuity. Neck:  Supple; no masses felt Lungs:  Respirations even and unlabored. Lungs clear to auscultation bilaterally.   No wheezes, crackles, or rhonchi.  Heart:  Regular rate and rhythm Abdomen:  Soft, nondistended,significant mid lower / RLQ tenderness.. Normal bowel sounds. No appreciable masses or hepatomegaly.  Rectal:  Not performed.  Msk:  Symmetrical without gross deformities.  Extremities:  Without edema. Neurologic:  Alert and  oriented x4;  grossly normal neurologically. Skin:  Intact without significant lesions or rashes. Cervical Nodes:  No significant cervical adenopathy. Psych:  Alert and cooperative. Normal affect.  LAB RESULTS:  Basename 10/11/12 0415  WBC 12.4*  HGB 11.0*  HCT 33.2*  PLT 502*   BMET  Basename 10/11/12 0415  NA 137  K 3.6  CL 100  CO2 23  GLUCOSE 106*  BUN 4*  CREATININE 0.62  CALCIUM 9.3   LFT  Basename 10/11/12 0415  PROT 8.2  ALBUMIN 2.7*  AST 18  ALT 24  ALKPHOS 126*  BILITOT 0.4  BILIDIR --  IBILI --    STUDIES: Ct Abdomen Pelvis W Contrast  10/11/2012  *RADIOLOGY REPORT*  Clinical Data: Abdominal pain.  Emesis.  CT ABDOMEN AND PELVIS WITH CONTRAST  Technique:  Multidetector CT imaging of the abdomen and pelvis was performed  following the standard protocol during bolus administration of intravenous contrast.  Contrast: 80mL OMNIPAQUE IOHEXOL 300 MG/ML  SOLN  Comparison: 08/12/2012  Findings: Focal fatty infiltration in segment 4B of the liver.  Spleen, pancreas, and adrenal glands unremarkable.  Gallbladder surgically absent.  No pathologic retroperitoneal or porta hepatis adenopathy is identified.  The kidneys appear unremarkable, as do the proximal ureters.  Marked wall thickening of the terminal ileum and adjacent cecum noted, with adjacent free fluid and a small but abnormal amount of complex pelvic ascites.  Mildly prominent pericecal lymph nodes are present.  The appendix is not readily seen.  Orally administered contrast is present in the stomach  and the proximal and mid small bowel.  5.3 x 6.1 x 4.3 cm left ovarian dermoid noted with fatty and calcific elements.  Uterus unremarkable.  Right ovary mildly enlarged but indistinctly marginated due to adjacent inflammatory findings.  Sacroiliac joints unremarkable.  IMPRESSION:  1.  Marked inflammatory findings in the terminal ileum and adjacent cecum, with enlarged pericecal lymph nodes.  The appearance favors Crohn's disease/terminal ileitis, with infectious enterocolitis less likely given the segmental involvement.  No definite extraluminal gas although there is mild complex ascites in the pelvis. 2.  The appendix is completely obscured.  Although the epicenter of the inflammatory process appears to be in the terminal ileum and cecum, this makes appendicitis difficult to completely exclude. 3.  Left ovarian dermoid. 4.  Right ovary is obscured by surrounding inflammatory findings. Given the complex ascites, pelvic sonography to rule out the unlikely possibility of concomitant right ovarian torsion may be warranted.   Original Report Authenticated By: Gaylyn Rong, M.D.      PREVIOUS ENDOSCOPIES: EGD ([facility]) in September - Gastritis found.  IMPRESSION / PLAN: 45.  21 year old female with nausea, vomiting, 20 pound weight loss, intermittent fevers and abdominal pain since August. CT scan reveals inflammatory findings in the terminal ileum and adjacent cecum with enlarged pericecal lymph nodes. Appendicitis not excluded so given duration of symptoms, this is less likely. Doubt infectious process given duration of illness and lack of loose stool. Overall findings concerning for Crohn's disease. Agree with antibiotics. Will start IV steroids. Patient needs a colonoscopy with intubation of the terminal ileum.. procedure explained to patient and her parents, patient agrees to proceed. We'll prep bowels today for a colonoscopy. CRP level is pending.  2. microcytic anemia, probably secondary to #1. Patient describes normal menses   Thanks   LOS: 0 days   Willette Cluster  10/11/2012, 1:22 PM

## 2012-10-11 NOTE — Consult Note (Signed)
Pt with worsening N/V  and constipation & TI thickening x 20cm very suggestive of Crohn's Doubt appendicitis Agree with TV ultrasound to eval right adnexa as well Agree with GI consultation Prob outpt eval for 6cm ovarian mass (prob dermoid)  Will follow peripherally No OR unless evidence of perf/fistula/etc Prob repeat CT in 5-7days

## 2012-10-11 NOTE — ED Notes (Signed)
Pt returned from CT scan.

## 2012-10-11 NOTE — H&P (Addendum)
[facility] History and Physical  Sarah Morrison MVH:[id number] DOB: June 23, 1991 DOA: 10/11/2012  Referring physician: Dr. Leonette Most B. Bernette Mayers PCP: No primary provider on file.  Specialists: General surgery [facility]  Chief Complaint: Abdominal discomfort  HPI: Sarah Morrison is a 21 y.o. female  With history of chronic abdominal discomfort with Endoscopy by her GI Doctor (Dr. [name]) in September.  Patient presents with several months complaints of abdominal discomfort which she reports that progressively her discomfort has got worse.  Nothing makes it better and patient reports that sometimes when her abd discomfort is bothering her she vomits anything she tries to consume.  She reports poor oral intake as a result.  The pain waxes and wanes.  She has noticed blood with bowel movements, the blood is located mainly on the tissues paper.  She denies any recent sexual intercourse and ED pregnancy test negative.    CT of her Abdomen in the ED shows maked inflammatory findings in the terminal ileum and adjacent cecum, with enlarged pericecal lymph nodes. Per report the appearance favors Chron's disease/terminal ileatis, with infectious enterocolitis less likely given the segmental involvement.  Ed healthcare providers consulted General surgery who reportedly were going to follow in consult. I contacted [facility] for help with further work up   Review of Systems: The patient denies anorexia, fever, weight loss,, vision loss, decreased hearing, hoarseness, chest pain, syncope, dyspnea on exertion, peripheral edema, balance deficits, hemoptysis, + abdominal pain, melena, hematochezia, severe indigestion/heartburn, hematuria, incontinence, genital sores, muscle weakness, suspicious skin lesions, transient blindness, difficulty walking, depression, unusual weight change, abnormal bleeding, enlarged lymph nodes, angioedema, and breast masses.    Past Medical History  Diagnosis Date  . Gastritis     Past Surgical History  Procedure Date  . Cholecystectomy    Social History:  reports that she has never smoked. She does not have any smokeless tobacco history on file. She reports that she does not drink alcohol or use illicit drugs. Lives at home with family  Can patient participate in ADLs? yes  No Known Allergies  No family history on file. PT is adopted and therefore does not know of illnesses that run the in the family  Prior to Admission medications   Medication Sig Start Date End Date Taking? Authorizing Provider  DiphenhydrAMINE HCl, Sleep, (ZZZQUIL) 25 MG CAPS Take 2 tablets by mouth at bedtime as needed. For sleep   Yes Historical Provider, MD  pantoprazole (PROTONIX) 40 MG tablet Take 40 mg by mouth daily.   Yes Historical Provider, MD  Wheat Dextrin (BENEFIBER PO) Take 5 mLs by mouth daily.   Yes Historical Provider, MD   Physical Exam: Filed Vitals:   10/11/12 0925 10/11/12 0948 10/11/12 1001 10/11/12 1127  BP: 126/83 125/85 120/80 117/71  Pulse: 104 103 100 107  Temp:  99.5 F (37.5 C)  99.9 F (37.7 C)  TempSrc:  Oral  Oral  Resp: 20 18 18 16   Height:      Weight:      SpO2: 99% 100% 97% 99%     General:  Pt in NAD, A and O x 3  Eyes: EOMI  ENT: normal exterior appearance, dry mucus membranes  Neck: supple, no goiter  Cardiovascular: RRR, No MRG  Respiratory: CTA BL, no wheezes  Abdomen: soft, tenderness at lower quadrants but worse at RLQ, no rebound tenderness, no guarding, non distended  Skin: warm, dry  Musculoskeletal: No cyanosis or clubbing  Psychiatric: mood and affect appropriate  Neurologic: answers questions appropriately and moves all extremities  Labs on Admission:  Basic Metabolic Panel:  Lab 10/11/12 1610  NA 137  K 3.6  CL 100  CO2 23  GLUCOSE 106*  BUN 4*  CREATININE 0.62  CALCIUM 9.3  MG --  PHOS --   Liver Function Tests:  Lab 10/11/12 0415  AST 18  ALT 24  ALKPHOS 126*  BILITOT 0.4  PROT 8.2    ALBUMIN 2.7*    Lab 10/11/12 0415  LIPASE 24  AMYLASE --   No results found for this basename: AMMONIA:5 in the last 168 hours CBC:  Lab 10/11/12 0415  WBC 12.4*  NEUTROABS 9.8*  HGB 11.0*  HCT 33.2*  MCV 73.8*  PLT 502*   Cardiac Enzymes: No results found for this basename: CKTOTAL:5,CKMB:5,CKMBINDEX:5,TROPONINI:5 in the last 168 hours  BNP (last 3 results) No results found for this basename: PROBNP:3 in the last 8760 hours CBG: No results found for this basename: GLUCAP:5 in the last 168 hours  Radiological Exams on Admission: Ct Abdomen Pelvis W Contrast  10/11/2012  *RADIOLOGY REPORT*  Clinical Data: Abdominal pain.  Emesis.  CT ABDOMEN AND PELVIS WITH CONTRAST  Technique:  Multidetector CT imaging of the abdomen and pelvis was performed following the standard protocol during bolus administration of intravenous contrast.  Contrast: 80mL OMNIPAQUE IOHEXOL 300 MG/ML  SOLN  Comparison: 08/12/2012  Findings: Focal fatty infiltration in segment 4B of the liver.  Spleen, pancreas, and adrenal glands unremarkable.  Gallbladder surgically absent.  No pathologic retroperitoneal or porta hepatis adenopathy is identified.  The kidneys appear unremarkable, as do the proximal ureters.  Marked wall thickening of the terminal ileum and adjacent cecum noted, with adjacent free fluid and a small but abnormal amount of complex pelvic ascites.  Mildly prominent pericecal lymph nodes are present.  The appendix is not readily seen.  Orally administered contrast is present in the stomach and the proximal and mid small bowel.  5.3 x 6.1 x 4.3 cm left ovarian dermoid noted with fatty and calcific elements.  Uterus unremarkable.  Right ovary mildly enlarged but indistinctly marginated due to adjacent inflammatory findings.  Sacroiliac joints unremarkable.  IMPRESSION:  1.  Marked inflammatory findings in the terminal ileum and adjacent cecum, with enlarged pericecal lymph nodes.  The appearance favors  Crohn's disease/terminal ileitis, with infectious enterocolitis less likely given the segmental involvement.  No definite extraluminal gas although there is mild complex ascites in the pelvis. 2.  The appendix is completely obscured.  Although the epicenter of the inflammatory process appears to be in the terminal ileum and cecum, this makes appendicitis difficult to completely exclude. 3.  Left ovarian dermoid. 4.  Right ovary is obscured by surrounding inflammatory findings. Given the complex ascites, pelvic sonography to rule out the unlikely possibility of concomitant right ovarian torsion may be warranted.   Original Report Authenticated By: Gaylyn Rong, M.D.      Assessment/Plan Principal Problem:  *Ileitis, probable Crohn's disease Active Problems:  Reflux   1. Ileatis, probable Crohn's disease - General surgery will follow along at this juncture - Transvaginal ultrasound - Discussed case with [facility] and they will follow along - CMP reviewed and AST and ALT within normal limits - Urinalysis was negative - Ed pregnancy test negative - Serum Iron, Vitamin B 12, CRP, Liver function test, cbc next am Addendum: -Although infectious colitis less likely will cover with flagyl and cipro  2. GERD - continue home regimen  Code Status: Full  Family Communication: Spoke to family at bedside Disposition Plan: Pending further recommendations from specialist involved  Time spent: > 50 minutes  Penny Pia [facility] Pager [phone]  If 7PM-7AM, please contact night-coverage [email or web address] Password [email or web address] 10/11/2012, 11:31 AM

## 2012-10-11 NOTE — Telephone Encounter (Signed)
Patient was actually admitted to [facility] today for possible crohn's.  She will be following up with Dr. Arlyce Dice.  I have passed her records from [place] on to [place] CMA for Dr. Arlyce Dice

## 2012-10-11 NOTE — ED Notes (Signed)
CT made aware pt finished contrast.  

## 2012-10-12 ENCOUNTER — Encounter (HOSPITAL_COMMUNITY)
Admission: EM | Disposition: A | Discharge status: Home / Self Care | Payer: Self-pay | Source: Home / Self Care | Attending: Emergency Medicine

## 2012-10-12 ENCOUNTER — Encounter (HOSPITAL_COMMUNITY): Payer: Self-pay | Admitting: *Deleted

## 2012-10-12 HISTORY — PX: COLONOSCOPY: SHX5424

## 2012-10-12 LAB — CBC
Hemoglobin: 10 g/dL — ABNORMAL LOW (ref 12.0–15.0)
MCH: 24.4 pg — ABNORMAL LOW (ref 26.0–34.0)
MCHC: 32.4 g/dL (ref 30.0–36.0)
MCV: 75.4 fL — ABNORMAL LOW (ref 78.0–100.0)
Platelets: 441 10*3/uL — ABNORMAL HIGH (ref 150–400)
RBC: 4.1 MIL/uL (ref 3.87–5.11)

## 2012-10-12 LAB — BASIC METABOLIC PANEL
CO2: 25 mEq/L (ref 19–32)
Calcium: 9.3 mg/dL (ref 8.4–10.5)
Creatinine, Ser: 0.68 mg/dL (ref 0.50–1.10)
Glucose, Bld: 81 mg/dL (ref 70–99)

## 2012-10-12 SURGERY — COLONOSCOPY
Anesthesia: Moderate Sedation

## 2012-10-12 MED ORDER — DIPHENHYDRAMINE HCL 50 MG/ML IJ SOLN
INTRAMUSCULAR | Status: DC | PRN
  Administered 2012-10-12 (×2): 25 mg via INTRAVENOUS

## 2012-10-12 MED ORDER — FENTANYL CITRATE 0.05 MG/ML IJ SOLN
INTRAMUSCULAR | Status: AC
  Filled 2012-10-12: qty 4

## 2012-10-12 MED ORDER — MIDAZOLAM HCL 5 MG/ML IJ SOLN
INTRAMUSCULAR | Status: AC
  Filled 2012-10-12: qty 3

## 2012-10-12 MED ORDER — MIDAZOLAM HCL 5 MG/5ML IJ SOLN
INTRAMUSCULAR | Status: DC | PRN
  Administered 2012-10-12 (×4): 2 mg via INTRAVENOUS

## 2012-10-12 MED ORDER — DIPHENHYDRAMINE HCL 50 MG/ML IJ SOLN
INTRAMUSCULAR | Status: AC
  Filled 2012-10-12: qty 1

## 2012-10-12 MED ORDER — MESALAMINE 1.2 G PO TBEC
2.4000 g | DELAYED_RELEASE_TABLET | Freq: Every day | ORAL | Status: DC
  Administered 2012-10-13: 2.4 g via ORAL
  Filled 2012-10-12 (×2): qty 2

## 2012-10-12 MED ORDER — FENTANYL CITRATE 0.05 MG/ML IJ SOLN
INTRAMUSCULAR | Status: DC | PRN
  Administered 2012-10-12: 25 ug via INTRAVENOUS
  Administered 2012-10-12: 50 ug via INTRAVENOUS
  Administered 2012-10-12: 25 ug via INTRAVENOUS

## 2012-10-12 NOTE — Interval H&P Note (Signed)
History and Physical Interval Note:  10/12/2012 12:51 PM  Sarah Morrison  has presented today for surgery, with the diagnosis of ileitis, anemia, abdominal pain  The various methods of treatment have been discussed with the patient and family. After consideration of risks, benefits and other options for treatment, the patient has consented to  Procedure(s) (LRB) with comments: COLONOSCOPY (N/A) as a surgical intervention .  The patient's history has been reviewed, patient examined, no change in status, stable for surgery.  I have reviewed the patient's chart and labs.  Questions were answered to the patient's satisfaction.     The recent H&P (dated *10/11/12**) was reviewed, the patient was examined and there is no change in the patients condition since that H&P was completed.   Melvia Heaps  10/12/2012, 12:51 PM   Melvia Heaps

## 2012-10-12 NOTE — Progress Notes (Signed)
Subjective: Patient seen , admitted with abdominal pain.CT abdomen  showed possible ileitis/ crohn's disease. Patient underwent colonoscopy which showed severe inflammation of the cecum consistent with crohn's disease.  Objective: Vital signs in last 24 hours: Temp:  [97.6 F (36.4 C)-98.2 F (36.8 C)] 97.6 F (36.4 C) (11/06 1321) Pulse Rate:  [103] 103  (11/05 2044) Resp:  [11-17] 11  (11/06 1400) BP: (101-152)/(54-108) 113/65 mmHg (11/06 1400) SpO2:  [96 %-100 %] 98 % (11/06 1400) Weight change: -0.181 kg (-6.4 oz) Last BM Date: 10/10/12  Consults: GI   Procedures: Colonoscopy  Intake/Output from previous day: 11/05 0701 - 11/06 0700 In: -  Out: 7 [Stool:7]     Physical Exam: Head: Normocephalic, atraumatic.  Eyes: No signs of jaundice, EOMI Nose: Mucous membranes dry.  Neck: supple,No deformities, masses, or tenderness noted. Lungs: Normal respiratory effort. B/L Clear to auscultation, no crackles or wheezes.  Heart: Regular RR. S1 and S2 normal  Abdomen: BS normoactive. Soft, Nondistended, non-tender.  Extremities: No pretibial edema, no erythema   Lab Results: Basic Metabolic Panel:  Basename 10/12/12 0604 10/11/12 0415  NA 139 137  K 4.0 3.6  CL 102 100  CO2 25 23  GLUCOSE 81 106*  BUN 5* 4*  CREATININE 0.68 0.62  CALCIUM 9.3 9.3  MG -- --  PHOS -- --   Liver Function Tests:  [facility] 10/11/12 1348 10/11/12 0415  AST 21 18  ALT 25 24  ALKPHOS 115 126*  BILITOT 0.4 0.4  PROT 7.3 8.2  ALBUMIN 2.4* 2.7*    Basename 10/11/12 0415  LIPASE 24  AMYLASE --   No results found for this basename: AMMONIA:2 in the last 72 hours CBC:  Basename 10/12/12 0604 10/11/12 0415  WBC 7.8 12.4*  NEUTROABS -- 9.8*  HGB 10.0* 11.0*  HCT 30.9* 33.2*  MCV 75.4* 73.8*  PLT 441* 502*   Anemia Panel:  Basename 10/11/12 1348  VITAMINB12 509  FOLATE --  FERRITIN --  TIBC --  IRON 19*  RETICCTPCT --   Coagulation:  Basename 10/12/12 0604  LABPROT  15.1  INR 1.21   Urine Drug Screen: Drugs of Abuse  No results found for this basename: labopia, cocainscrnur, labbenz, amphetmu, thcu, labbarb    Alcohol Level: No results found for this basename: ETH:2 in the last 72 hours Urinalysis:  Basename 10/11/12 0440  COLORURINE YELLOW  LABSPEC 1.009  PHURINE 8.0  GLUCOSEU NEGATIVE  HGBUR NEGATIVE  BILIRUBINUR NEGATIVE  KETONESUR NEGATIVE  PROTEINUR NEGATIVE  UROBILINOGEN 1.0  NITRITE NEGATIVE  LEUKOCYTESUR NEGATIVE    No results found for this or any previous visit (from the past 240 hour(s)).  Studies/Results: US Pelvis Complete  10/11/2012  *RADIOLOGY REPORT*  Clinical Data:  Assess for ovarian torsion.  History of Crohn disease.  Left dermoid cyst.  Gravida 0, para 0.  LMP 3 weeks ago.  TRANSABDOMINAL ULTRASOUND OF PELVIS DOPPLER ULTRASOUND OF OVARIES  Technique:  Transabdominal ultrasound examination of the pelvis was performed including evaluation of the uterus, ovaries, adnexal regions, and pelvic cul-de-sac.  Color and duplex Doppler ultrasound was utilized to evaluate blood flow to the ovaries.  Comparison:  CT of the abdomen and pelvis 10/11/2012  Findings:  Uterus:  The uterus is poorly visualized on this transabdominal evaluation, but is estimated to measure 5.8 x 1.9 x 3.5 cm.  Endometrium:  Estimated to measure 3 mm in thickness.  Right ovary:  3.2 x 2.3 x 2.9 cm.  No mass identified on transabdominal evaluation.  Left ovary:  5.6 x 5.3 x 5.3 cm.  Hyperechoic and hypoechoic portions are identified, consistent with dermoid as seen on CT exam.  The entire mass is likely not well seen by ultrasound.  Pulsed Doppler evaluation demonstrates normal low-resistance arterial and venous waveforms in both ovaries.  Additional findings:  There is a small amount of free pelvic fluid.  IMPRESSION:  1.  No evidence for ovarian torsion. 2.  Complex left adnexal mass, consistent with dermoid. 3.  Uterus is poorly visualized on this transabdominal  exam.   Original Report Authenticated By: Norva Pavlov, M.D.    Ct Abdomen Pelvis W Contrast  10/11/2012  *RADIOLOGY REPORT*  Clinical Data: Abdominal pain.  Emesis.  CT ABDOMEN AND PELVIS WITH CONTRAST  Technique:  Multidetector CT imaging of the abdomen and pelvis was performed following the standard protocol during bolus administration of intravenous contrast.  Contrast: 80mL OMNIPAQUE IOHEXOL 300 MG/ML  SOLN  Comparison: 08/12/2012  Findings: Focal fatty infiltration in segment 4B of the liver.  Spleen, pancreas, and adrenal glands unremarkable.  Gallbladder surgically absent.  No pathologic retroperitoneal or porta hepatis adenopathy is identified.  The kidneys appear unremarkable, as do the proximal ureters.  Marked wall thickening of the terminal ileum and adjacent cecum noted, with adjacent free fluid and a small but abnormal amount of complex pelvic ascites.  Mildly prominent pericecal lymph nodes are present.  The appendix is not readily seen.  Orally administered contrast is present in the stomach and the proximal and mid small bowel.  5.3 x 6.1 x 4.3 cm left ovarian dermoid noted with fatty and calcific elements.  Uterus unremarkable.  Right ovary mildly enlarged but indistinctly marginated due to adjacent inflammatory findings.  Sacroiliac joints unremarkable.  IMPRESSION:  1.  Marked inflammatory findings in the terminal ileum and adjacent cecum, with enlarged pericecal lymph nodes.  The appearance favors Crohn's disease/terminal ileitis, with infectious enterocolitis less likely given the segmental involvement.  No definite extraluminal gas although there is mild complex ascites in the pelvis. 2.  The appendix is completely obscured.  Although the epicenter of the inflammatory process appears to be in the terminal ileum and cecum, this makes appendicitis difficult to completely exclude. 3.  Left ovarian dermoid. 4.  Right ovary is obscured by surrounding inflammatory findings. Given the complex  ascites, pelvic sonography to rule out the unlikely possibility of concomitant right ovarian torsion may be warranted.   Original Report Authenticated By: Gaylyn Rong, M.D.    [place] Art/ven Flow Abd Pelv Doppler  10/12/2012  *RADIOLOGY REPORT*  Clinical Data:  Assess for ovarian torsion.  History of Crohn disease.  Left dermoid cyst.  Gravida 0, para 0.  LMP 3 weeks ago.  TRANSABDOMINAL ULTRASOUND OF PELVIS DOPPLER ULTRASOUND OF OVARIES  Technique:  Transabdominal ultrasound examination of the pelvis was performed including evaluation of the uterus, ovaries, adnexal regions, and pelvic cul-de-sac.  Color and duplex Doppler ultrasound was utilized to evaluate blood flow to the ovaries.  Comparison:  CT of the abdomen and pelvis 10/11/2012  Findings:  Uterus:  The uterus is poorly visualized on this transabdominal evaluation, but is estimated to measure 5.8 x 1.9 x 3.5 cm.  Endometrium:  Estimated to measure 3 mm in thickness.  Right ovary:  3.2 x 2.3 x 2.9 cm.  No mass identified on transabdominal evaluation.  Left ovary:  5.6 x 5.3 x 5.3 cm.  Hyperechoic and hypoechoic portions are identified, consistent with dermoid as seen on CT exam.  The  entire mass is likely not well seen by ultrasound.  Pulsed Doppler evaluation demonstrates normal low-resistance arterial and venous waveforms in both ovaries.  Additional findings:  There is a small amount of free pelvic fluid.  IMPRESSION:  1.  No evidence for ovarian torsion. 2.  Complex left adnexal mass, consistent with dermoid. 3.  Uterus is poorly visualized on this transabdominal exam.   Original Report Authenticated By: Norva Pavlov, M.D.     Medications: Scheduled Meds:   . ciprofloxacin  400 mg Intravenous BID  . [COMPLETED] lactated ringers  1,000 mL Intravenous Once  . lip balm   Topical BID  . mesalamine  2.4 g Oral Q breakfast  . methylPREDNISolone (SOLU-MEDROL) injection  20 mg Intravenous Q12H  . metronidazole  500 mg Intravenous Q8H  .  pantoprazole  40 mg Oral Daily  . [COMPLETED] peg 3350 powder  1 kit Oral Once  . [DISCONTINUED] polyethylene glycol-electrolytes  4,000 mL Oral Once   Continuous Infusions:   . sodium chloride 75 mL/hr at 10/11/12 1949  . 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 GI following  [facility] Pager: [phone] 10/12/2012, 4:08 PM

## 2012-10-12 NOTE — Op Note (Addendum)
[facility] [address] [place], [address]   COLONOSCOPY PROCEDURE REPORT  PATIENT: Sarah Morrison, Sarah Morrison  MR#: [id number] BIRTHDATE: 03/09/1991 , 21  yrs. old GENDER: Female ENDOSCOPIST: Louis Meckel, MD REFERRED BY: PROCEDURE DATE:  10/12/2012 PROCEDURE:   Colonoscopy with biopsy ASA CLASS:   Class II INDICATIONS:abnormal CT suggesting Crohn's disease. MEDICATIONS: These medications were titrated to patient response per physician's verbal order, Versed 8 mg IV, Fentanyl 100 mcg IV, and Benadryl 25 mg IV  DESCRIPTION OF PROCEDURE:   After the risks benefits and alternatives of the procedure were thoroughly explained, informed consent was obtained.  A digital rectal exam revealed no abnormalities of the rectum.   The Pentax Colonoscope [id number] endoscope was introduced through the anus and advanced to the cecum, which was identified by the appearance. No adverse events experienced.   The quality of the prep was Suprep excellent  The instrument was then slowly withdrawn as the colon was fully examined.      COLON FINDINGS: In the area of the cecum there was extensive inflammation of the mucosa.  Landmarks including the ileocecal valve could not be seen.  Multiple biopsies were taken.  The mucosa was also friable and, in places, nodular but soft.   In the area of the cecum there was extensive inflammation of the mucosa. Landmarks including the ileocecal valve could not be seen. Multiple biopsies were taken.  The mucosa was also friable and, in places, nodular but soft.   The colon mucosa was otherwise normal. Retroflexed views revealed no abnormalities. The time to cecum=  . Withdrawal time=  .  The scope was withdrawn and the procedure completed. COMPLICATIONS: There were no complications.  ENDOSCOPIC IMPRESSION: 1.   severe inflammation of the cecum consistent with Crohn's disease  RECOMMENDATIONS: 1.  continue solumedrol 2.   lialda  eSigned:  Louis Meckel, MD 10/12/2012 1:33 PM Revised: 10/12/2012 1:33 PM  cc:

## 2012-10-12 NOTE — Progress Notes (Signed)
See colonoscopy report. Findings consistent with Crohn's disease.  Recommendations #1 continue Cipro and Flagyl until the fall she has returned #2 continue Solu-Medrol but change to 40 mg IV once daily #3 begin Lialda 2.4 g daily in view of colonic involvement with Crohn's #4 low fiber diet

## 2012-10-12 NOTE — Progress Notes (Signed)
  ANTIBIOTIC CONSULT NOTE - INITIAL  Pharmacy Consult for Cipro Indication: R/O infectious colitis vs. Crohn's disease  No Known Allergies  Patient Measurements: Height: 5\' 8"  (172.7 cm) Weight: 164 lb 9.6 oz (74.662 kg) IBW/kg (Calculated) : 63.9  Adjusted Body Weight:   Vital Signs:   Intake/Output from previous day: 11/05 0701 - 11/06 0700 In: -  Out: 7 [Stool:7] Intake/Output from this shift:    Labs:  [facility] 10/12/12 0604 10/11/12 0415  WBC 7.8 12.4*  HGB 10.0* 11.0*  PLT 441* 502*  LABCREA -- --  CREATININE 0.68 0.62   Estimated Creatinine Clearance: 114.1 ml/min (by C-G formula based on Cr of 0.68). No results found for this basename: VANCOTROUGH:2,VANCOPEAK:2,VANCORANDOM:2,GENTTROUGH:2,GENTPEAK:2,GENTRANDOM:2,TOBRATROUGH:2,TOBRAPEAK:2,TOBRARND:2,AMIKACINPEAK:2,AMIKACINTROU:2,AMIKACIN:2, in the last 72 hours   Microbiology: No results found for this or any previous visit (from the past 720 hour(s)).  Medical History: Past Medical History  Diagnosis Date  . Gastritis   . 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 Ileitis, probable Crohn's disease 10/11/2012    CT 10/11/2012: Marked inflammatory findings in the terminal ileum and adjacent  cecum, with enlarged pericecal lymph nodes. The appearance favors  Crohn's disease/terminal ileitis, with infectious enterocolitis  less likely given the segmental involvement. No definite  extraluminal gas although there is mild complex ascites in the  pelvis.      Medications:  Prescriptions prior to admission  Medication Sig Dispense Refill  . DiphenhydrAMINE HCl, Sleep, (ZZZQUIL) 25 MG CAPS Take 2 tablets by mouth at bedtime as needed. For sleep      . pantoprazole (PROTONIX) 40 MG tablet Take 40 mg by mouth daily.      . Wheat Dextrin (BENEFIBER PO) Take 5 mLs by mouth daily.       Assessment: 21yof admitted with abdominal pain and started on Cipro and Flagyl for r/o infectious colitis vs. Crohn's  disease. Patient remains afebrile and WBC have trended down to wnl. Patient is scheduled for colonoscopy today. - CrCl >100 ml/min - Pregnancy test: negative  Plan:  1. Continue Cipro 400mg  IV q12h and Flagyl 500mg  IV q8h - doses appropriate 2. Follow-up renal function, LOT and surgical/antibiotic plan  Cleon Dew [phone] 10/12/2012,10:29 AM

## 2012-10-13 ENCOUNTER — Encounter (HOSPITAL_COMMUNITY): Payer: Self-pay

## 2012-10-13 ENCOUNTER — Encounter (HOSPITAL_COMMUNITY): Payer: Self-pay | Admitting: Gastroenterology

## 2012-10-13 DIAGNOSIS — D279 Benign neoplasm of unspecified ovary: Secondary | ICD-10-CM

## 2012-10-13 MED ORDER — OXYCODONE HCL 5 MG PO TABS: 5.0000 mg | ORAL_TABLET | ORAL | Status: DC | PRN

## 2012-10-13 MED ORDER — METRONIDAZOLE 500 MG PO TABS: 500.0000 mg | ORAL_TABLET | Freq: Three times a day (TID) | ORAL | Status: AC

## 2012-10-13 MED ORDER — PREDNISONE (PAK) 10 MG PO TABS: 20.0000 mg | ORAL_TABLET | Freq: Every day | ORAL | Status: DC

## 2012-10-13 MED ORDER — MESALAMINE 1.2 G PO TBEC: 2.4000 g | DELAYED_RELEASE_TABLET | Freq: Every day | ORAL | Status: DC

## 2012-10-13 NOTE — Discharge Summary (Addendum)
Physician Discharge Summary  Sarah Morrison:[id number] DOB: July 15, 1991 DOA: 10/11/2012  PCP: Cala Bradford, MD  Admit date: 10/11/2012 Discharge date: 10/13/2012  Time spent: 65  minutes  Recommendations for Outpatient Follow-up:  1. PCP in 2 weeks 2. 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 [name]) in September. Patient presents with several months complaints of abdominal discomfort which she reports that progressively her discomfort has got worse. Nothing makes it better and patient reports that sometimes when her abd discomfort is bothering her she vomits anything she tries to consume. She reports poor oral intake as a result. The pain waxes and wanes. She has noticed blood with bowel movements, the blood is located mainly on the tissues paper. She denies any recent sexual intercourse and ED pregnancy test negative.  CT of her Abdomen in the ED shows maked inflammatory findings in the terminal ileum and adjacent cecum, with enlarged pericecal lymph nodes. Per report the appearance favors Chron's disease/terminal ileatis, with infectious enterocolitis less likely given the segmental involvement.      Hospital Course:   Crohn's disease Patient came with abdominal pain, and was started on antibiotics. CT showed possible ileitis/crohn's disease. Her symptoms have improved. Patient had colonoscopy which showed inflammatory changes in cecum, consistent with crohn's disease. Will follow up with GI as outpatient, will continue with Lialda, Prednisone 40 mg po daily and Flagyl  500 mg po TID Continue with low residue diet   Procedures:  Colonoscopy:   Consultations:  GI  Discharge Exam: Filed Vitals:   10/12/12 2104 10/13/12 0458 10/13/12 0822 10/13/12 1024  BP: 120/75 111/65 138/84 121/76  Pulse: 87 83 78 82  Temp: 98.3 F (36.8 C) 98.1 F (36.7 C) 98.1 F (36.7 C) 97.7 F (36.5 C)  TempSrc: Oral Oral Oral Oral  Resp: 16 16 14 12   Height:      Weight:      SpO2: 100% 100% 100% 100%    General: Appear in no acute distress Cardiovascular: S1s2 RRR Respiratory: Clear bilaterally  Discharge Instructions  Discharge Orders    Future Appointments: Provider: Department: Dept Phone: Center:   11/02/2012 8:30 AM Meredith Pel, NP [facility] [phone] [facility]     Future Orders Please Complete By Expires   Diet general      Scheduling Instructions:   Low fiber diet   Increase activity slowly      Discharge instructions      Comments:   Low fiber diet       Medication List     As of 10/13/2012  2:17 PM    STOP taking these medications         BENEFIBER PO      TAKE these medications         mesalamine 1.2 G EC tablet   Commonly known as: LIALDA   Take 2 tablets (2.4 g total) by mouth daily with breakfast.      metroNIDAZOLE 500 MG tablet   Commonly known as: FLAGYL   Take 1 tablet (500 mg total) by mouth 3 (  three) times daily.      oxyCODONE 5 MG immediate release tablet   Commonly known as: Oxy IR/ROXICODONE   Take 1 tablet (5 mg total) by mouth every 4 (four) hours as needed.      pantoprazole 40 MG tablet   Commonly known as: PROTONIX   Take 40 mg by mouth daily.      predniSONE 10 MG tablet   Commonly known as: STERAPRED UNI-PAK   Take 2 tablets (20 mg total) by mouth daily.      ZZZQUIL 25 MG Caps   Generic drug: DiphenhydrAMINE HCl (Sleep)   Take 2 tablets by mouth at bedtime as needed. For sleep           Follow-up Information    Follow up with Cala Bradford, MD.    Contact information:   [address] [place] [address] [phone]       Follow up with Mickel Baas, MD. On 11/02/2012. (At 8: 30 am)    Contact information:   [address] [place] [address] [phone]           The results of significant diagnostics from this hospitalization (including imaging, microbiology, ancillary and laboratory) are listed below for reference.    Significant Diagnostic Studies: US Pelvis Complete  10/11/2012  *RADIOLOGY REPORT*  Clinical Data:  Assess for ovarian torsion.  History of Crohn disease.  Left dermoid cyst.  Gravida 0, para 0.  LMP 3 weeks ago.  TRANSABDOMINAL ULTRASOUND OF PELVIS DOPPLER ULTRASOUND OF OVARIES  Technique:  Transabdominal ultrasound examination of the pelvis was performed including evaluation of the uterus, ovaries, adnexal regions, and pelvic cul-de-sac.  Color and duplex Doppler ultrasound was utilized to evaluate blood flow to the ovaries.  Comparison:  CT of the abdomen and pelvis 10/11/2012  Findings:  Uterus:  The uterus is poorly visualized on this transabdominal evaluation, but is estimated to measure 5.8 x 1.9 x 3.5 cm.  Endometrium:  Estimated to measure 3 mm in thickness.  Right ovary:  3.2 x 2.3 x 2.9 cm.  No mass identified on transabdominal evaluation.  Left ovary:  5.6 x 5.3 x 5.3 cm.  Hyperechoic and hypoechoic portions are identified, consistent with dermoid as seen on CT exam.  The entire mass is likely not well seen by ultrasound.  Pulsed Doppler evaluation demonstrates normal low-resistance arterial and venous waveforms in both ovaries.  Additional findings:  There is a small amount of free pelvic fluid.  IMPRESSION:  1.  No evidence for ovarian torsion. 2.  Complex left adnexal mass, consistent with dermoid. 3.  Uterus is poorly visualized on this transabdominal exam.   Original Report Authenticated By: Norva Pavlov, M.D.    Ct Abdomen Pelvis W Contrast  10/11/2012  *RADIOLOGY  REPORT*  Clinical Data: Abdominal pain.  Emesis.  CT ABDOMEN AND PELVIS WITH CONTRAST  Technique:  Multidetector CT imaging of the abdomen and pelvis was performed following the standard protocol during bolus administration of intravenous contrast.  Contrast: 80mL OMNIPAQUE IOHEXOL 300 MG/ML  SOLN  Comparison: 08/12/2012  Findings: Focal fatty infiltration in segment 4B of the liver.  Spleen, pancreas, and adrenal glands unremarkable.  Gallbladder surgically absent.  No pathologic retroperitoneal or porta hepatis adenopathy is identified.  The kidneys appear unremarkable, as do the proximal ureters.  Marked wall thickening of the terminal ileum and adjacent cecum noted, with adjacent free fluid and a small but abnormal amount of complex pelvic ascites.  Mildly prominent  pericecal lymph nodes are present.  The appendix is not readily seen.  Orally administered contrast is present in the stomach and the proximal and mid small bowel.  5.3 x 6.1 x 4.3 cm left ovarian dermoid noted with fatty and calcific elements.  Uterus unremarkable.  Right ovary mildly enlarged but indistinctly marginated due to adjacent inflammatory findings.  Sacroiliac joints unremarkable.  IMPRESSION:  1.  Marked inflammatory findings in the terminal ileum and adjacent cecum, with enlarged pericecal lymph nodes.  The appearance favors Crohn's disease/terminal ileitis, with infectious enterocolitis less likely given the segmental involvement.  No definite extraluminal gas although there is mild complex ascites in the pelvis. 2.  The appendix is completely obscured.  Although the epicenter of the inflammatory process appears to be in the terminal ileum and cecum, this makes appendicitis difficult to completely exclude. 3.  Left ovarian dermoid. 4.  Right ovary is obscured by surrounding inflammatory findings. Given the complex ascites, pelvic sonography to rule out the unlikely possibility of concomitant right ovarian torsion may be warranted.    Original Report Authenticated By: Gaylyn Rong, M.D.    [place] Art/ven Flow Abd Pelv Doppler  10/12/2012  *RADIOLOGY REPORT*  Clinical Data:  Assess for ovarian torsion.  History of Crohn disease.  Left dermoid cyst.  Gravida 0, para 0.  LMP 3 weeks ago.  TRANSABDOMINAL ULTRASOUND OF PELVIS DOPPLER ULTRASOUND OF OVARIES  Technique:  Transabdominal ultrasound examination of the pelvis was performed including evaluation of the uterus, ovaries, adnexal regions, and pelvic cul-de-sac.  Color and duplex Doppler ultrasound was utilized to evaluate blood flow to the ovaries.  Comparison:  CT of the abdomen and pelvis 10/11/2012  Findings:  Uterus:  The uterus is poorly visualized on this transabdominal evaluation, but is estimated to measure 5.8 x 1.9 x 3.5 cm.  Endometrium:  Estimated to measure 3 mm in thickness.  Right ovary:  3.2 x 2.3 x 2.9 cm.  No mass identified on transabdominal evaluation.  Left ovary:  5.6 x 5.3 x 5.3 cm.  Hyperechoic and hypoechoic portions are identified, consistent with dermoid as seen on CT exam.  The entire mass is likely not well seen by ultrasound.  Pulsed Doppler evaluation demonstrates normal low-resistance arterial and venous waveforms in both ovaries.  Additional findings:  There is a small amount of free pelvic fluid.  IMPRESSION:  1.  No evidence for ovarian torsion. 2.  Complex left adnexal mass, consistent with dermoid. 3.  Uterus is poorly visualized on this transabdominal exam.   Original Report Authenticated By: Norva Pavlov, M.D.     Microbiology: No results found for this or any previous visit (from the past 240 hour(s)).   Labs: Basic Metabolic Panel:  Lab 10/12/12 6962 10/11/12 0415  NA 139 137  K 4.0 3.6  CL 102 100  CO2 25 23  GLUCOSE 81 106*  BUN 5* 4*  CREATININE 0.68 0.62  CALCIUM 9.3 9.3  MG -- --  PHOS -- --   Liver Function Tests:  Lab 10/11/12 1348 10/11/12 0415  AST 21 18  ALT 25 24  ALKPHOS 115 126*  BILITOT 0.4 0.4  PROT 7.3 8.2   ALBUMIN 2.4* 2.7*    Lab 10/11/12 0415  LIPASE 24  AMYLASE --   No results found for this basename: AMMONIA:5 in the last 168 hours CBC:  Lab 10/12/12 0604 10/11/12 0415  WBC 7.8 12.4*  NEUTROABS -- 9.8*  HGB 10.0* 11.0*  HCT 30.9* 33.2*  MCV 75.4* 73.8*  PLT 441* 502*      Signed:  Tally Mckinnon S  [facility] 10/13/2012, 2:17 PM

## 2012-10-13 NOTE — Care Management Note (Signed)
    Page 1 of 1   10/13/2012     2:39:22 PM   CARE MANAGEMENT NOTE 10/13/2012  Patient:  Sarah Morrison, Sarah Morrison   Account Number:  [id number]  Date Initiated:  10/11/2012  Documentation initiated by:  Jacquelynn Cree  Subjective/Objective Assessment:   Admitted with abd pain, possible Crohn's     Action/Plan:   return home   Anticipated DC Date:  10/13/2012   Anticipated DC Plan:  HOME/SELF CARE      DC Planning Services  CM consult      Choice offered to / List presented to:             Status of service:  Completed, signed off Medicare Important Message given?   (If response is "NO", the following Medicare IM given date fields will be blank) Date Medicare IM given:   Date Additional Medicare IM given:    Discharge Disposition:  HOME/SELF CARE  Per UR Regulation:  Reviewed for med. necessity/level of care/duration of stay  If discussed at Long Length of Stay Meetings, dates discussed:    Comments:

## 2012-10-13 NOTE — Progress Notes (Signed)
[facility] Progress Note  SUBJECTIVE: feels better today.   OBJECTIVE:  Vital signs in last 24 hours: Temp:  [97.6 F (36.4 C)-98.3 F (36.8 C)] 98.1 F (36.7 C) (11/07 0822) Pulse Rate:  [78-87] 78  (11/07 0822) Resp:  [11-17] 14  (11/07 0822) BP: (101-152)/(54-108) 138/84 mmHg (11/07 0822) SpO2:  [96 %-100 %] 100 % (11/07 0822) Last BM Date: 10/10/12 General:    Pleasant black female in NAD Abdomen:  Soft, nontender and nondistended. Normal bowel sounds. Neurologic:  Alert and oriented,  grossly normal neurologically. Psych:  Cooperative. Normal mood and affect.   Lab Results:  [facility] 10/12/12 0604 10/11/12 0415  WBC 7.8 12.4*  HGB 10.0* 11.0*  HCT 30.9* 33.2*  PLT 441* 502*   BMET  Basename 10/12/12 0604 10/11/12 0415  NA 139 137  K 4.0 3.6  CL 102 100  CO2 25 23  GLUCOSE 81 106*  BUN 5* 4*  CREATININE 0.68 0.62  CALCIUM 9.3 9.3   LFT  Basename 10/11/12 1348  PROT 7.3  ALBUMIN 2.4*  AST 21  ALT 25  ALKPHOS 115  BILITOT 0.4  BILIDIR 0.1  IBILI 0.3   PT/INR  Basename 10/12/12 0604  LABPROT 15.1  INR 1.21    Studies/Results: US Pelvis Complete  10/11/2012  *RADIOLOGY REPORT*  Clinical Data:  Assess for ovarian torsion.  History of Crohn disease.  Left dermoid cyst.  Gravida 0, para 0.  LMP 3 weeks ago.  TRANSABDOMINAL ULTRASOUND OF PELVIS DOPPLER ULTRASOUND OF OVARIES  Technique:  Transabdominal ultrasound examination of the pelvis was performed including evaluation of the uterus, ovaries, adnexal regions, and pelvic cul-de-sac.  Color and duplex Doppler ultrasound was utilized to evaluate blood flow to the ovaries.  Comparison:  CT of the abdomen and pelvis 10/11/2012  Findings:  Uterus:  The uterus is poorly visualized on this transabdominal evaluation, but is estimated to measure 5.8 x 1.9 x 3.5 cm.  Endometrium:  Estimated to measure 3 mm in thickness.  Right ovary:  3.2 x 2.3 x 2.9 cm.  No mass identified on transabdominal  evaluation.  Left ovary:  5.6 x 5.3 x 5.3 cm.  Hyperechoic and hypoechoic portions are identified, consistent with dermoid as seen on CT exam.  The entire mass is likely not well seen by ultrasound.  Pulsed Doppler evaluation demonstrates normal low-resistance arterial and venous waveforms in both ovaries.  Additional findings:  There is a small amount of free pelvic fluid.  IMPRESSION:  1.  No evidence for ovarian torsion. 2.  Complex left adnexal mass, consistent with dermoid. 3.  Uterus is poorly visualized on this transabdominal exam.   Original Report Authenticated By: Norva Pavlov, M.D.    [place] Art/ven Flow Abd Pelv Doppler  10/12/2012  *RADIOLOGY REPORT*  Clinical Data:  Assess for ovarian torsion.  History of Crohn disease.  Left dermoid cyst.  Gravida 0, para 0.  LMP 3 weeks ago.  TRANSABDOMINAL ULTRASOUND OF PELVIS DOPPLER ULTRASOUND OF OVARIES  Technique:  Transabdominal ultrasound examination of the pelvis was performed including evaluation of the uterus, ovaries, adnexal regions, and pelvic cul-de-sac.  Color and duplex Doppler ultrasound was utilized to evaluate blood flow to the ovaries.  Comparison:  CT of the abdomen and pelvis 10/11/2012  Findings:  Uterus:  The uterus is poorly visualized on this transabdominal evaluation, but is estimated to measure 5.8 x 1.9 x 3.5 cm.  Endometrium:  Estimated to measure 3 mm in thickness.  Right ovary:  3.2 x  2.3 x 2.9 cm.  No mass identified on transabdominal evaluation.  Left ovary:  5.6 x 5.3 x 5.3 cm.  Hyperechoic and hypoechoic portions are identified, consistent with dermoid as seen on CT exam.  The entire mass is likely not well seen by ultrasound.  Pulsed Doppler evaluation demonstrates normal low-resistance arterial and venous waveforms in both ovaries.  Additional findings:  There is a small amount of free pelvic fluid.  IMPRESSION:  1.  No evidence for ovarian torsion. 2.  Complex left adnexal mass, consistent with dermoid. 3.  Uterus is poorly  visualized on this transabdominal exam.   Original Report Authenticated By: Norva Pavlov, M.D.      ASSESSMENT / PLAN:  Nausea, vomiting, 20 pound weight loss, intermittent fevers and abdominal pain. She had severe inflammation of cecum on colonoscopy yesterday. Biopsies pending but findings c/w  Crohn's disease. Continue antibiotics, IV steroids. Mesalamine started yesterday. Patient is feeling better, less abdominal pain. No vomiting. She does have small amount of blood in her stool. Follow up appointment on 11/02/12 at 8:30am.  Upon discharge she should be on Lialda 2.4 grams daily, Prednisone 40mg  daily and low fiber diet. She knows to call office in meantime for questions or worsening symptoms.    LOS: 2 days   Willette Cluster  10/13/2012, 10:00 AM

## 2012-10-13 NOTE — ED Provider Notes (Signed)
Medical screening examination/treatment/procedure(s) were performed by non-physician practitioner and as supervising physician I was immediately available for consultation/collaboration.   Charles B. Sheldon, MD 10/13/12 1014 

## 2012-10-13 NOTE — Progress Notes (Signed)
I have personally taken an interval history, reviewed the chart, and examined the patient.  I agree with the extender's note, impression and recommendations.  Bartley Vuolo D. Angelamarie Avakian, MD, FACG [facility] [phone]  

## 2012-10-22 NOTE — ED Provider Notes (Signed)
Medical screening examination/treatment/procedure(s) were conducted as a shared visit with non-physician practitioner(s) and myself.  I personally evaluated the patient during the encounter    Vida Roller, MD 10/22/12 [phone]

## 2012-10-24 ENCOUNTER — Ambulatory Visit: Payer: Managed Care, Other (non HMO) | Admitting: Gastroenterology

## 2012-10-27 ENCOUNTER — Other Ambulatory Visit: Payer: Self-pay | Admitting: Nurse Practitioner

## 2012-10-27 NOTE — Telephone Encounter (Signed)
Spoke with patient and she is taking her Lialda and Prednisone taper. She states she will finish with the Flagyl tomorrow. She will continue the Lialda and Prednisone.

## 2012-11-02 ENCOUNTER — Ambulatory Visit (INDEPENDENT_AMBULATORY_CARE_PROVIDER_SITE_OTHER): Payer: Managed Care, Other (non HMO) | Admitting: Nurse Practitioner

## 2012-11-02 ENCOUNTER — Encounter: Payer: Self-pay | Admitting: Nurse Practitioner

## 2012-11-02 ENCOUNTER — Other Ambulatory Visit (INDEPENDENT_AMBULATORY_CARE_PROVIDER_SITE_OTHER): Payer: Managed Care, Other (non HMO)

## 2012-11-02 VITALS — BP 120/72 | HR 111 | Ht 68.0 in | Wt 167.4 lb

## 2012-11-02 DIAGNOSIS — K5090 Crohn's disease, unspecified, without complications: Secondary | ICD-10-CM

## 2012-11-02 DIAGNOSIS — K5000 Crohn's disease of small intestine without complications: Secondary | ICD-10-CM

## 2012-11-02 LAB — CBC WITH DIFFERENTIAL/PLATELET
Basophils Absolute: 0 10*3/uL (ref 0.0–0.1)
Eosinophils Absolute: 0.2 10*3/uL (ref 0.0–0.7)
HCT: 35.9 % — ABNORMAL LOW (ref 36.0–46.0)
Hemoglobin: 11.5 g/dL — ABNORMAL LOW (ref 12.0–15.0)
Lymphs Abs: 1.4 10*3/uL (ref 0.7–4.0)
MCHC: 32.1 g/dL (ref 30.0–36.0)
Monocytes Absolute: 1.2 10*3/uL — ABNORMAL HIGH (ref 0.1–1.0)
Neutro Abs: 11.1 10*3/uL — ABNORMAL HIGH (ref 1.4–7.7)
RDW: 15.6 % — ABNORMAL HIGH (ref 11.5–14.6)

## 2012-11-02 NOTE — Patient Instructions (Addendum)
Please go to the basement level to have your labs drawn.   We made you a follow up appointment with Dr. Arlyce Dice on 12-12-2012 at 9:15 Am. Please call if you have problems and need to be seen sooner.  Have a Happy Thanksgiving.

## 2012-11-05 NOTE — Progress Notes (Signed)
11/05/2012 Sarah Morrison [id number] 07/14/1991   HISTORY OF PRESENT ILLNESS: Patient is a 21 year old female recently hospitalized with nausea, vomiting, abdominal pain and weight loss. CT scan revealed marked wall thickening of the terminal ileum and adjacent cecum with adjacent free fluid and and complex pelvic ascites. Patient was started with flagyl and IV steroids upon admission and we were consulted for further evaluation. A colonoscopy with biopsies by Dr. Arlyce Dice 10/12/12 revealed severe inflammation of the cecum. Following the colonoscopy patient was started on Lialda. A couple of days later patient was discharge d on prednisone, flagyl and lialda. Colon pathology was c/w active chronic colitis.   Sarah Morrison comes in today for hospital follow up. She feels much better.No nausea or vomiting. No further weight loss. BMs are normal. Sarah Morrison does mention that since completing the flagyl a week ago, she has developed some recurrent RLQ discomfort.   Past Medical History  Diagnosis Date  . Gastritis   . 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 Ileitis, probable Crohn's disease 10/11/2012    CT 10/11/2012: Marked inflammatory findings in the terminal ileum and adjacent  cecum, with enlarged pericecal lymph nodes. The appearance favors  Crohn's disease/terminal ileitis, with infectious enterocolitis  less likely given the segmental involvement. No definite  extraluminal gas although there is mild complex ascites in the  pelvis.     Past Surgical History  Procedure Date  . 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 Esophagogastroduodenoscopy   . Colonoscopy 10/12/2012    Procedure: COLONOSCOPY;  Surgeon: Louis Meckel, MD;  Location: [facility] ENDOSCOPY;  Service: Endoscopy;  Laterality: N/A;    reports that she has never smoked. She does not have any smokeless tobacco history on file. She reports that she does not drink alcohol or use  illicit drugs. family history is not on file. No Known Allergies    Outpatient Encounter Prescriptions as of 11/02/2012  Medication Sig Dispense Refill  . DiphenhydrAMINE HCl, Sleep, (ZZZQUIL) 25 MG CAPS Take 2 tablets by mouth at bedtime as needed. For sleep      . mesalamine (LIALDA) 1.2 G EC tablet Take 2 tablets (2.4 g total) by mouth daily with breakfast.  30 tablet  1  . oxyCODONE (OXY IR/ROXICODONE) 5 MG immediate release tablet Take 1 tablet (5 mg total) by mouth every 4 (four) hours as needed.  15 tablet  0  . pantoprazole (PROTONIX) 40 MG tablet Take 40 mg by mouth daily.      . predniSONE (STERAPRED UNI-PAK) 10 MG tablet Take 2 tablets (20 mg total) by mouth daily.  90 tablet  0     REVIEW OF SYSTEMS  : All other systems reviewed and negative except where noted in the History of Present Illness.   PHYSICAL EXAM: BP 120/72  Pulse 111  Ht 5\' 8"  (1.727 m)  Wt 167 lb 6.4 oz (75.932 kg)  BMI 25.45 kg/m2  SpO2 99%  LMP 10/19/2012 General: Pleasant, black female in no acute distress Head: Normocephalic and atraumatic Eyes:  sclerae anicteric,conjunctive pink. Ears: Normal auditory acuity Neck: Supple, no masses.  Lungs: Clear throughout to auscultation Heart: Regular rate and rhythm Abdomen: Soft, non distended, mild RLQ tenderness. No masses or hepatomegaly noted. Normal bowel sounds Musculoskeletal: Symmetrical with no gross deformities  Skin: No lesions on visible extremities Extremities: No edema  Neurological: Alert oriented x 4, grossly nonfocal Cervical Nodes:  No significant cervical adenopathy  Psychological:  Alert and cooperative. Normal mood and affect  ASSESSMENT AND PLAN:    21 year old female recently diagnosed with crohn's ileitis, significantly improved on Lialda 1.2gms daily, steroids and flagyl.  Prednisone dose currently at 20mg  daily. It is concerning that she has had some recurrent RLQ pain since completion of Flagyl last week. Today will check a  CBC, ESR and CRP. Patient will follow up with Dr. Arlyce Dice in approximately 3 weeks. In the meantime continue Prednisone 20mg  daily until called with lab results. Hopefully we can start tapering prednisone soon. There is room to increase her mesalamine if need be.

## 2012-11-07 NOTE — Progress Notes (Signed)
Reviewed and agree with management. Robert D. Kaplan, M.D., FACG  

## 2012-11-09 ENCOUNTER — Telehealth: Payer: Self-pay | Admitting: *Deleted

## 2012-11-09 NOTE — Telephone Encounter (Signed)
Message copied by Marlowe Kays on Wed Nov 09, 2012  1:18 PM ------      Message from: Meredith Pel      Created: Wed Nov 09, 2012 11:21 AM       Patient still on 20mg  Prednisone, likely why WBC elevated but CRP high. She was beginning to have some recurrent RLQ pain when I saw her in clinic. Suspect she will need additional crohns therapy soon (currently on mesalamine).  Zella Ball, please call patient and move appointment up from January to sometime in next 2 weeks. Also, if her abdominal pain is better, please have her decrease Prednisone to 15mg  daily. Thanks

## 2012-11-11 ENCOUNTER — Telehealth: Payer: Self-pay | Admitting: *Deleted

## 2012-11-11 NOTE — Telephone Encounter (Signed)
Message copied by Marlowe Kays on Fri Nov 11, 2012  1:36 PM ------      Message from: Meredith Pel      Created: Wed Nov 09, 2012 11:21 AM       Patient still on 20mg  Prednisone, likely why WBC elevated but CRP high. She was beginning to have some recurrent RLQ pain when I saw her in clinic. Suspect she will need additional crohns therapy soon (currently on mesalamine).  Zella Ball, please call patient and move appointment up from January to sometime in next 2 weeks. Also, if her abdominal pain is better, please have her decrease Prednisone to 15mg  daily. Thanks

## 2012-11-11 NOTE — Telephone Encounter (Signed)
Pt scheduled to see paula on Monday Will come pick up samples of Lialda and will also reduce prednisone to 15 mg

## 2012-11-11 NOTE — Telephone Encounter (Signed)
Pt is scheduled with paula on Monday Dec 9th. Patient is out of Lialda. Will come and pick up samples on Monday

## 2012-11-14 ENCOUNTER — Ambulatory Visit (INDEPENDENT_AMBULATORY_CARE_PROVIDER_SITE_OTHER): Payer: Managed Care, Other (non HMO) | Admitting: Nurse Practitioner

## 2012-11-14 ENCOUNTER — Other Ambulatory Visit: Payer: Managed Care, Other (non HMO)

## 2012-11-14 ENCOUNTER — Encounter: Payer: Self-pay | Admitting: Nurse Practitioner

## 2012-11-14 VITALS — BP 110/60 | HR 64 | Ht 67.0 in | Wt 164.6 lb

## 2012-11-14 DIAGNOSIS — K5090 Crohn's disease, unspecified, without complications: Secondary | ICD-10-CM | POA: Insufficient documentation

## 2012-11-14 LAB — HEPATITIS B SURFACE ANTIGEN: Hepatitis B Surface Ag: NEGATIVE

## 2012-11-14 LAB — HEPATITIS C ANTIBODY: HCV Ab: NEGATIVE

## 2012-11-14 MED ORDER — AZATHIOPRINE 50 MG PO TABS: 50.0000 mg | ORAL_TABLET | Freq: Every day | ORAL | Status: DC

## 2012-11-14 NOTE — Patient Instructions (Addendum)
You need calcium supplements. We recommend 1000 mg daily, this can be purchased over-the-counter. You need vitamin D supplements. We recommend 500 mcg daily, this can be purchased over-the-counter. You will need to go to the basement today for labs You will need to follow up in one month with Dr Arlyce Dice which is scheduled on 12/14/2012 at 2:45pm You will need labs done in 2 weeks  Addendum to above. Patient instructions reflecting below changes mailed to patient. Sarah Morrison, these are new instructions reflecting changes we made over the phone 11/15/12. We discussed prednisone taper which will be as follows : Prednisone 15 mg x7 days then Prednisone 10 mg x7 days then Prednisone 5 mg x7 days then Prednisone 5 mg every other day x7 days then discontinue.  In addition,Lialda will be increased to 4.8 grams daily (4 pills daily)

## 2012-11-14 NOTE — Progress Notes (Signed)
11/14/2012 Sarah Morrison [id number] [birth year]   History of Present Illness:  Patient is a pleasant 21 year old female recently diagnosed with Crohn's colitis.  She is on Lialda 2.4g daily. We reduced her prednisone from 20 mg to 15 mg daily a few days . Sarah Morrison was seen for a hospital follow up 11/02/12 at which time she was having some intermittent right lower discomfort.  Labs that day revealed a CRP elevated at 24, WBC 13.9 (on steroids), hemoglobin 11.5. Sarah Morrison is back today for scheduled followup appointment. She continues to have right lower quadrant pain, mainly postprandially. No nausea or vomiting. Bowel movements are normal.   Current Medications, Allergies, Past Medical History, Past Surgical History, Family History and Social History were reviewed in [employer] record.   Physical Exam: General:  pleasant .well developed , black female in no acute distress Head: Normocephalic and atraumatic Eyes:  sclerae anicteric, conjunctiva pink  Ears: Normal auditory acuity Lungs: Clear throughout to auscultation Heart: Regular rate and rhythm Abdomen: Soft, non distended, mild right lower quadrant tenderness. No masses, no hepatomegaly. Normal bowel sounds Musculoskeletal: Symmetrical with no gross deformities  Extremities: No edema  Neurological: Alert oriented x 4, grossly nonfocal Psychological:  Alert and cooperative. Normal mood and affect  Assessment and Recommendations:  21 year old black female recently diagnosed with Crohn's disease. She had severe inflammation involving the cecum on colonoscopy. Landmarks including the ileocecal valve could not be seen so terminal ileum was not intubated but CT scan prior to colonoscopy revealed terminal i ileum involvement.  Overall patient has significantly improved since beginning treatment though she is still having right lower quadrant pain, mostly after meals. Recent CRP elevated.   Will increase Lialda to 4.8gms  daily  Today will obtain labs for TPMT activity. Begin Imuran 50 mg daily. We discussed potential side effects of Imuran including, but not limited to, infection, bone marrow suppression , hepatotoxicity, and lymphoma, Patient will return for CBC and LFTs in 2 weeks.   She will continue prednisone taper . Continue prednisone 15 mg x7 days then decrease by 5 mg every 7 days.  See patient instructions.    Return for follow up in 3-4 weeks with Dr. Arlyce Dice. Patient will call in the interim for questions or worsening symptoms  Recommend vitamin D 500 units daily  Recommend calcium supplements 1000 mg daily.  In case biologics become necessary will go ahead and checi hepatitis B and C. Serologies. Patient also needs to be checked for TB. This will need to wait as prednisone has been shown to effect results of both PPD and Quantiferon Gold.

## 2012-11-15 ENCOUNTER — Encounter: Payer: Self-pay | Admitting: Nurse Practitioner

## 2012-11-15 NOTE — Progress Notes (Signed)
Reviewed and agree with management. Kittie Krizan D. Aarna Mihalko, M.D., FACG  

## 2012-11-17 ENCOUNTER — Other Ambulatory Visit: Payer: Self-pay | Admitting: *Deleted

## 2012-11-17 DIAGNOSIS — K5090 Crohn's disease, unspecified, without complications: Secondary | ICD-10-CM

## 2012-11-21 ENCOUNTER — Telehealth: Payer: Self-pay | Admitting: Gastroenterology

## 2012-11-21 MED ORDER — TRAMADOL HCL 50 MG PO TABS: ORAL_TABLET | ORAL | Status: DC

## 2012-11-21 NOTE — Telephone Encounter (Signed)
Pt is still taking prednisone, 10mg /day for the next 6 days. Pt is c/o right lower quadrant pain, states it started hurting again a couple of days ago. Pt is requesting something be called in for her abdominal pain. Please advise.

## 2012-11-21 NOTE — Telephone Encounter (Signed)
Ultram 50-100mg q6h prn 

## 2012-11-21 NOTE — Telephone Encounter (Signed)
Pt aware and script sent to the pharmacy. 

## 2012-11-28 ENCOUNTER — Telehealth: Payer: Self-pay | Admitting: Gastroenterology

## 2012-11-28 NOTE — Telephone Encounter (Signed)
Pt states she is still having abdominal pain. Pt got ultram last week for pain but states this is not helping. Pt is currently taking prednisone 5mg  daily and Imuran 50mg  daily. Pt asking for something different for pain. Discussed with pt that she probably needs to be seen. Pt scheduled to see Mike Gip PA tomorrow at 8:30am. Pt aware of appt date and time.

## 2012-11-29 ENCOUNTER — Other Ambulatory Visit (INDEPENDENT_AMBULATORY_CARE_PROVIDER_SITE_OTHER): Payer: Managed Care, Other (non HMO)

## 2012-11-29 ENCOUNTER — Encounter: Payer: Self-pay | Admitting: Physician Assistant

## 2012-11-29 ENCOUNTER — Ambulatory Visit (INDEPENDENT_AMBULATORY_CARE_PROVIDER_SITE_OTHER): Payer: Managed Care, Other (non HMO) | Admitting: Physician Assistant

## 2012-11-29 VITALS — BP 100/70 | HR 70 | Ht 67.75 in | Wt 162.2 lb

## 2012-11-29 DIAGNOSIS — K5090 Crohn's disease, unspecified, without complications: Secondary | ICD-10-CM

## 2012-11-29 LAB — COMPREHENSIVE METABOLIC PANEL
Albumin: 3.2 g/dL — ABNORMAL LOW (ref 3.5–5.2)
BUN: 13 mg/dL (ref 6–23)
CO2: 28 mEq/L (ref 19–32)
Calcium: 9 mg/dL (ref 8.4–10.5)
Chloride: 102 mEq/L (ref 96–112)
Glucose, Bld: 91 mg/dL (ref 70–99)
Potassium: 4 mEq/L (ref 3.5–5.1)
Total Protein: 7.4 g/dL (ref 6.0–8.3)

## 2012-11-29 LAB — CBC WITH DIFFERENTIAL/PLATELET
Basophils Relative: 0 % (ref 0.0–3.0)
Eosinophils Absolute: 0.2 10*3/uL (ref 0.0–0.7)
Eosinophils Relative: 1.8 % (ref 0.0–5.0)
Lymphocytes Relative: 12.1 % (ref 12.0–46.0)
Neutrophils Relative %: 77.3 % — ABNORMAL HIGH (ref 43.0–77.0)
Platelets: 364 10*3/uL (ref 150.0–400.0)
RBC: 4.69 Mil/uL (ref 3.87–5.11)
WBC: 8.7 10*3/uL (ref 4.5–10.5)

## 2012-11-29 LAB — HEPATIC FUNCTION PANEL
ALT: 11 U/L (ref 0–35)
Alkaline Phosphatase: 64 U/L (ref 39–117)
Bilirubin, Direct: 0.1 mg/dL (ref 0.0–0.3)
Total Bilirubin: 0.3 mg/dL (ref 0.3–1.2)
Total Protein: 7.4 g/dL (ref 6.0–8.3)

## 2012-11-29 MED ORDER — HYDROCODONE-ACETAMINOPHEN 5-500 MG PO TABS: ORAL_TABLET | ORAL | Status: DC

## 2012-11-29 MED ORDER — AZATHIOPRINE 50 MG PO TABS: 100.0000 mg | ORAL_TABLET | Freq: Every day | ORAL | Status: DC

## 2012-11-29 MED ORDER — PREDNISONE 20 MG PO TABS: 40.0000 mg | ORAL_TABLET | Freq: Every day | ORAL | Status: DC

## 2012-11-29 NOTE — Progress Notes (Signed)
Subjective:    Patient ID: Sarah Morrison, female    DOB: 11-06-1991, 21 y.o.   MRN: [id number]  HPI Sarah Morrison is a very nice 21 year old female known to Dr. Arlyce Dice who was recently diagnosed with Crohn's ileocolitis and was hospitalized earlier this fall. She had CT scan done on 10/11/2012 which showed marked inflammatory changes in the terminal ileum and adjacent cecum and a left ovarian dermoid as well as a small amount of complex pelvic ascites. Subsequent colonoscopy done on 10/12/2012 showed extensive inflammation in the cecum with landmarks including the ileocecal valve unable to be visualized. Biopsies are consistent with Crohn's disease . She was started on steroids and Sarah Morrison all the and at the time of last office visit on 11/15/2012 was also started on azathioprine. She is currently taking 100 mg of azathioprine and 4.8 g of Lialda  daily. She had been started on 20 mg of prednisone and that has been slowly tapered down to 5 mg of prednisone over the past few weeks. She comes in today for followup and states that she's actually been feeling worse than that she's having more abdominal pain than when she left the hospital in November. She has no appetite and her weight has gradually been decreasing. She is lost 2 pounds since her last office visit 2 weeks ago. She has not had any fever or chills no nausea or vomiting. She is generally having one to 2 bowel movements per day and has not seen any blood. Her main complaint is pain and soreness in her lower abdomen which has been fairly constant and worse over the past week or so. She has tramadol to use for pain but says that this does not help. She has had hepatitis serologies done which were negative , has not had TB testing done as yet as she had been on steroids.    Review of Systems  Constitutional: Positive for activity change, appetite change, fatigue and unexpected weight change.  HENT: Negative.   Eyes: Negative.   Respiratory: Negative.    Cardiovascular: Negative.   Gastrointestinal: Positive for abdominal pain.  Genitourinary: Negative.   Neurological: Negative.   Hematological: Negative.   Psychiatric/Behavioral: Negative.    Outpatient Prescriptions Prior to Visit  Medication Sig Dispense Refill  . mesalamine (LIALDA) 1.2 G EC tablet Take 2 tablets (2.4 g total) by mouth daily with breakfast.  30 tablet  1  . traMADol (ULTRAM) 50 MG tablet Take 1-2 tablets by mouth every 6 hours as needed for pain  30 tablet  0  . [DISCONTINUED] azaTHIOprine (IMURAN) 50 MG tablet Take 1 tablet (50 mg total) by mouth daily.  30 tablet  3  . [DISCONTINUED] predniSONE (STERAPRED UNI-PAK) 10 MG tablet Take 2 tablets (20 mg total) by mouth daily.  90 tablet  0  . [DISCONTINUED] oxyCODONE (OXY IR/ROXICODONE) 5 MG immediate release tablet Take 1 tablet (5 mg total) by mouth every 4 (four) hours as needed.  15 tablet  0   Last reviewed on 11/29/2012 10:08 AM by Sarah Cooper, PA     No Known Allergies Patient Active Problem List  Diagnosis  . Ileitis, probable Crohn's disease  . Reflux  . Dermoid cyst of ovary, 6.1cm by CT [id number]  . Crohn's disease   History  Substance Use Topics  . Smoking status: Never Smoker   . Smokeless tobacco: Never Used  . Alcohol Use: No    Objective:   Physical Exam O. well-developed young [place] American female in  no acute distress, pleasant blood pressure 100/70 pulse 70 height 5 foot 7 weight 162. HEENT; nontraumatic normocephalic EOMI PERRLA sclera anicteric,Neck; Supple no JVD, Cardiovascular; regular rate and rhythm with S1-S2 no murmur or gallop, Pulmonary; clear bilaterally, Abdomen; soft she is quite tender in the right mid quadrant or right lower quadrant there is no rebound no palpable mass or hepatosplenomegaly she also has mild tenderness in the left lower quadrant, Rectal; exam not done, Extremities; no clubbing cyanosis or edema skin warm and dry, Psych; mood and affect normal and  appropriate.        Assessment & Plan:  #1 21 year old female with new diagnosis of Crohn's ileocolitis with extensive and marked inflammatory changes Her pain has increased over the past 2 weeks with  decrease in steroids. She has just been started on azathioprine within the past 2 weeks  Plan; continue azathioprine at 100 mg by mouth daily Continually Lialda  4.8 g daily Increase prednisone to 40 mg by mouth every morning and suspect she will need a long slow tapering. I also believe she is going to need biologic therapy to get her disease under control and have given her information today to read about Humira Will check TB quantity her on goal today Check CBC [place] met today 2 weeks after initiating Imuran We'll plan to see back in the office in 7-10 days-and likely start on Humira at that time.

## 2012-11-29 NOTE — Patient Instructions (Addendum)
Please go to basement for lab work before leaving today.  Please stay on your Lialda.  Please increase Azathioprine to 100 mg once daily, new prescription was sent to your pharmacy.  Please increase Prednisone to 40 mg once daily, new prescription was sent to your pharmacy.  I have faxed your prescription for Vicodin to your pharmacy, please take as directed.   You have a follow up appointment with Mike Gip, PA-C on 12-06-2012 at 9:30 am.  We have given you information to read on Humira.

## 2012-12-01 NOTE — Progress Notes (Signed)
Reviewed and agree with management. Hubert Raatz D. Demichael Traum, M.D., FACG  

## 2012-12-06 ENCOUNTER — Ambulatory Visit (INDEPENDENT_AMBULATORY_CARE_PROVIDER_SITE_OTHER): Payer: Managed Care, Other (non HMO) | Admitting: Physician Assistant

## 2012-12-06 ENCOUNTER — Encounter: Payer: Self-pay | Admitting: Physician Assistant

## 2012-12-06 VITALS — BP 124/88 | HR 88 | Ht 67.75 in | Wt 167.0 lb

## 2012-12-06 DIAGNOSIS — K5080 Crohn's disease of both small and large intestine without complications: Secondary | ICD-10-CM

## 2012-12-06 NOTE — Patient Instructions (Addendum)
Decrease Prednisone to 30 mg daily x 10 days.                  Prednisone to 20 mg daily x 10 days                  Prednisone to 10 mg daily x 10 days Then stop taking the Prednisone if symptoms gone. Continue all other medications.  Keep your appointment with Dr. Arlyce Dice on 12-14-2012 at 2:45 PM.

## 2012-12-06 NOTE — Progress Notes (Signed)
Subjective:    Patient ID: Sarah Morrison, female    DOB: 1991-10-08, 21 y.o.   MRN: [id number]  HPI  Sarah Morrison is very nice 21 year old female known to Dr. Arlyce Morrison with recent diagnosis of Crohn's ileocolitis. She was seen in the office on 11/29/2012 by myself at that time with an increase in abdominal pain, and poor appetite. She had been started on a course of steroids in early December per Dr. Arlyce Morrison and also started on azathioprine at that time. Prednisone was started at 20 mg per day and she was tapered down to 5 mg over a couple of weeks.  CT scan which was done on 10/11/2012 showed marked inflammatory changes of the terminal ileum and adjacent cecum as well as a left ovarian dermoid small amount of complex pelvic ascites. Colonoscopy done on 10/12/2012 had shown extensive inflammation in the cecum with inability to visualize the ileocecal valve due to extensive inflammation. Biopsies were consistent with Crohn's Her prednisone was increased to 40 mg by mouth daily at the time of her last office visit and she was continued on azathioprine and lialda  4.8 g daily. She comes back in today for followup and states that she feels a lot better. Her abdominal pain has settled down quite a bit and she is now only having one to 2 bowel movements per day and has not seen any blood over the past several days. She had gone on a cruise with her family over the holidays says that she, caught a cold on the way back-is mostly experiencing a sore throat over the past 2 day,.no  fever that she's aware of. No oral problems with cough chest congestion etc.    Review of Systems  Constitutional: Negative.   HENT: Positive for sore throat.   Eyes: Negative.   Cardiovascular: Negative.   Gastrointestinal: Negative.   Genitourinary: Negative.   Musculoskeletal: Negative.   Skin: Negative.   Neurological: Negative.   Hematological: Negative.   Psychiatric/Behavioral: Negative.    Outpatient Prescriptions Prior  to Visit  Medication Sig Dispense Refill  . azaTHIOprine (IMURAN) 50 MG tablet Take 2 tablets (100 mg total) by mouth daily.  30 tablet  3  . HYDROcodone-acetaminophen (VICODIN) 5-500 MG per tablet Please take one tablet by mouth every 4-6 hours as needed for pain  30 tablet  0  . mesalamine (LIALDA) 1.2 G EC tablet Take 2 tablets (2.4 g total) by mouth daily with breakfast.  30 tablet  1  . predniSONE (DELTASONE) 20 MG tablet Take 2 tablets (40 mg total) by mouth daily.  60 tablet  0  . [DISCONTINUED] traMADol (ULTRAM) 50 MG tablet Take 1-2 tablets by mouth every 6 hours as needed for pain  30 tablet  0   Last reviewed on 12/06/2012  9:55 AM by Richardson Chiquito, CMA  No Known Allergies     Patient Active Problem List  Diagnosis  . Ileitis, probable Crohn's disease  . Reflux  . Dermoid cyst of ovary, 6.1cm by CT [id number]  . Crohn's disease   History  Substance Use Topics  . Smoking status: Never Smoker   . Smokeless tobacco: Never Used  . Alcohol Use: No    Objective:   Physical Exam well-developed young African female in no acute distress, pleasant blood pressure 124/88 pulse 88 height 5 foot 7 weight 167. HEENT; nontraumatic normocephalic EOMI PERRLA sclera anicteric, Neck; supple no JVD, Cardiovascular; regular rate and rhythm with S1-S2 no murmur or gallop, Pulmonary; clear  bilaterally, Abdomen; soft mildly tender bilateral lower quadrants no guarding or rebound no palpable mass or hepatosplenomegaly bowel sounds are active, Rectal; exam not done, Extremities; no clubbing cyanosis or edema skin warm and dry, Psych; mood and affect normal and appropriate.        Assessment & Plan:  #72  21 year old female with new diagnosis of Crohn's ileocolitis with extensive inflammation. She is now improving with higher doses of steroids over the past one week. Will plan a slow her steroid taper. Feel that she will benefit from addition of a biologic  Plan; decrease prednisone to 30 mg by  mouth daily over the next 10 days, then 20 mg by mouth daily over 10 days then 10 mg by mouth daily x10 days. Continue  lialda  4.8 g daily Continue azathioprine 100 mg by mouth daily which was just started at the beginning of December. Hepatitis serologies and TB quantiferon are both negative Have begun discussions regarding Humira, and will get this preauthorized for her. She is followup with Dr. Arlyce Morrison on January 8, and can  decide at that time regarding starting the  Humira

## 2012-12-08 ENCOUNTER — Encounter (HOSPITAL_COMMUNITY): Payer: Self-pay

## 2012-12-08 ENCOUNTER — Emergency Department (HOSPITAL_COMMUNITY)
Admission: EM | Admit: 2012-12-08 | Discharge: 2012-12-08 | Disposition: A | Discharge status: Home / Self Care | Payer: No Typology Code available for payment source | Attending: Emergency Medicine | Admitting: Emergency Medicine

## 2012-12-08 DIAGNOSIS — Z8742 Personal history of other diseases of the female genital tract: Secondary | ICD-10-CM | POA: Insufficient documentation

## 2012-12-08 DIAGNOSIS — S1093XA Contusion of unspecified part of neck, initial encounter: Secondary | ICD-10-CM

## 2012-12-08 DIAGNOSIS — S0003XA Contusion of scalp, initial encounter: Secondary | ICD-10-CM | POA: Insufficient documentation

## 2012-12-08 DIAGNOSIS — Z79899 Other long term (current) drug therapy: Secondary | ICD-10-CM | POA: Insufficient documentation

## 2012-12-08 DIAGNOSIS — Z8719 Personal history of other diseases of the digestive system: Secondary | ICD-10-CM | POA: Insufficient documentation

## 2012-12-08 DIAGNOSIS — IMO0002 Reserved for concepts with insufficient information to code with codable children: Secondary | ICD-10-CM | POA: Insufficient documentation

## 2012-12-08 DIAGNOSIS — Y939 Activity, unspecified: Secondary | ICD-10-CM | POA: Insufficient documentation

## 2012-12-08 DIAGNOSIS — S0083XA Contusion of other part of head, initial encounter: Secondary | ICD-10-CM | POA: Insufficient documentation

## 2012-12-08 DIAGNOSIS — Y92410 Unspecified street and highway as the place of occurrence of the external cause: Secondary | ICD-10-CM | POA: Insufficient documentation

## 2012-12-08 DIAGNOSIS — S8000XA Contusion of unspecified knee, initial encounter: Secondary | ICD-10-CM | POA: Insufficient documentation
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
## 2012-12-12 ENCOUNTER — Ambulatory Visit: Payer: Managed Care, Other (non HMO) | Admitting: Gastroenterology

## 2012-12-12 NOTE — Progress Notes (Signed)
Reviewed and agree with management. Robert D. Kaplan, M.D., FACG  

## 2012-12-14 ENCOUNTER — Encounter: Payer: Self-pay | Admitting: Gastroenterology

## 2012-12-14 ENCOUNTER — Ambulatory Visit (INDEPENDENT_AMBULATORY_CARE_PROVIDER_SITE_OTHER): Payer: Managed Care, Other (non HMO) | Admitting: Gastroenterology

## 2012-12-14 VITALS — BP 108/72 | HR 72 | Ht 66.75 in | Wt 166.2 lb

## 2012-12-14 DIAGNOSIS — K5000 Crohn's disease of small intestine without complications: Secondary | ICD-10-CM

## 2012-12-14 NOTE — Progress Notes (Signed)
History of Present Illness:  Patient has returned for followup of Crohn's disease. She continues on a steroid taper and is currently taking 30 mg daily. She has no GI complaints. She remains on azathioprine 100 mg daily.    Review of Systems: Pertinent positive and negative review of systems were noted in the above HPI section. All other review of systems were otherwise negative.    Current Medications, Allergies, Past Medical History, Past Surgical History, Family History and Social History were reviewed in [employer] electronic medical record  Vital signs were reviewed in today's medical record. Physical Exam: General: Well developed , well nourished, no acute distress

## 2012-12-14 NOTE — Patient Instructions (Addendum)
Prednisone taper: Start 20 mg for 7 days Then 15 mg for 7 days Then 10 mg for 7 days Then 10 mg alternating with 5 mg for 7 days Then 5 mg for 7 days  Then Discontinue    Increase Azothioprine to 125mg  per day Follow up in 6 weeks

## 2012-12-14 NOTE — Assessment & Plan Note (Addendum)
Patient developed a flare with her initial steroid taper. Symptoms are under excellent control after increasing  prednisone which is now at 30 mg daily.  At this point I would continue a slow steroid taper while continuing lialda and azathioprine. The latter will be increased to 125 mg daily.  Should she develop recurrent symptoms with her taper than I would begin biologic therapy with Humira.

## 2012-12-28 ENCOUNTER — Other Ambulatory Visit: Payer: Self-pay | Admitting: Gastroenterology

## 2012-12-28 MED ORDER — AZATHIOPRINE 50 MG PO TABS: 100.0000 mg | ORAL_TABLET | Freq: Every day | ORAL | Status: DC

## 2012-12-28 NOTE — Telephone Encounter (Signed)
Med sent 90 day supply to [facility] today

## 2013-01-25 ENCOUNTER — Other Ambulatory Visit: Payer: Self-pay | Admitting: Physician Assistant

## 2013-02-01 NOTE — Addendum Note (Signed)
Addended byDerry Skill on: 02/01/2013 03:39 PM   Modules accepted: Orders

## 2013-02-09 MED ORDER — HYDROCODONE-ACETAMINOPHEN 5-500 MG PO TABS: ORAL_TABLET | ORAL | Status: DC

## 2013-02-13 ENCOUNTER — Telehealth: Payer: Self-pay | Admitting: Gastroenterology

## 2013-02-13 ENCOUNTER — Emergency Department (HOSPITAL_COMMUNITY)
Admission: EM | Admit: 2013-02-13 | Discharge: 2013-02-13 | Disposition: A | Discharge status: Home / Self Care | Payer: Managed Care, Other (non HMO) | Attending: Emergency Medicine | Admitting: Emergency Medicine

## 2013-02-13 ENCOUNTER — Encounter (HOSPITAL_COMMUNITY): Payer: Self-pay | Admitting: *Deleted

## 2013-02-13 ENCOUNTER — Emergency Department (HOSPITAL_COMMUNITY): Payer: Managed Care, Other (non HMO)

## 2013-02-13 DIAGNOSIS — Z3202 Encounter for pregnancy test, result negative: Secondary | ICD-10-CM | POA: Insufficient documentation

## 2013-02-13 DIAGNOSIS — Z8719 Personal history of other diseases of the digestive system: Secondary | ICD-10-CM | POA: Insufficient documentation

## 2013-02-13 DIAGNOSIS — R197 Diarrhea, unspecified: Secondary | ICD-10-CM | POA: Insufficient documentation

## 2013-02-13 DIAGNOSIS — Z79899 Other long term (current) drug therapy: Secondary | ICD-10-CM | POA: Insufficient documentation

## 2013-02-13 DIAGNOSIS — K5090 Crohn's disease, unspecified, without complications: Secondary | ICD-10-CM

## 2013-02-13 LAB — URINALYSIS, ROUTINE W REFLEX MICROSCOPIC
Glucose, UA: NEGATIVE mg/dL
Hgb urine dipstick: NEGATIVE
Ketones, ur: 15 mg/dL — AB
Nitrite: NEGATIVE
Protein, ur: NEGATIVE mg/dL
Specific Gravity, Urine: 1.033 — ABNORMAL HIGH (ref 1.005–1.030)
Urobilinogen, UA: 1 mg/dL (ref 0.0–1.0)
pH: 6.5 (ref 5.0–8.0)

## 2013-02-13 LAB — COMPREHENSIVE METABOLIC PANEL WITH GFR
ALT: 9 U/L (ref 0–35)
CO2: 24 meq/L (ref 19–32)
Calcium: 8.7 mg/dL (ref 8.4–10.5)
GFR calc Af Amer: >90 mL/min (ref >90)
GFR calc non Af Amer: >90 mL/min (ref >90)
Glucose, Bld: 89 mg/dL (ref 70–99)
Sodium: 139 meq/L (ref 135–145)

## 2013-02-13 LAB — COMPREHENSIVE METABOLIC PANEL
AST: 14 U/L (ref 0–37)
Albumin: 3 g/dL — ABNORMAL LOW (ref 3.5–5.2)
Alkaline Phosphatase: 77 U/L (ref 39–117)
BUN: 7 mg/dL (ref 6–23)
Chloride: 104 mEq/L (ref 96–112)
Creatinine, Ser: 0.64 mg/dL (ref 0.50–1.10)
Potassium: 3.6 mEq/L (ref 3.5–5.1)
Total Bilirubin: 0.3 mg/dL (ref 0.3–1.2)
Total Protein: 7.3 g/dL (ref 6.0–8.3)

## 2013-02-13 LAB — CBC WITH DIFFERENTIAL/PLATELET
Basophils Absolute: 0.1 10*3/uL (ref 0.0–0.1)
Basophils Relative: 0 % (ref 0–1)
Eosinophils Absolute: 0.3 10*3/uL (ref 0.0–0.7)
Eosinophils Relative: 2 % (ref 0–5)
HCT: 31.8 % — ABNORMAL LOW (ref 36.0–46.0)
Hemoglobin: 10.8 g/dL — ABNORMAL LOW (ref 12.0–15.0)
Lymphocytes Relative: 7 % — ABNORMAL LOW (ref 12–46)
Lymphs Abs: 0.9 K/uL (ref 0.7–4.0)
MCH: 27.6 pg (ref 26.0–34.0)
MCHC: 34 g/dL (ref 30.0–36.0)
MCV: 81.3 fL (ref 78.0–100.0)
Monocytes Absolute: 0.9 K/uL (ref 0.1–1.0)
Monocytes Relative: 7 % (ref 3–12)
Neutro Abs: 11 10*3/uL — ABNORMAL HIGH (ref 1.7–7.7)
Neutrophils Relative %: 84 % — ABNORMAL HIGH (ref 43–77)
Platelets: 389 K/uL (ref 150–400)
RBC: 3.91 MIL/uL (ref 3.87–5.11)
RDW: 14.3 % (ref 11.5–15.5)
WBC: 13.1 K/uL — ABNORMAL HIGH (ref 4.0–10.5)

## 2013-02-13 LAB — LIPASE, BLOOD: Lipase: 22 U/L (ref 11–59)

## 2013-02-13 LAB — URINE MICROSCOPIC-ADD ON

## 2013-02-13 MED ORDER — IOHEXOL 300 MG/ML  SOLN
25.0000 mL | INTRAMUSCULAR | Status: AC
  Administered 2013-02-13 (×2): 25 mL via ORAL

## 2013-02-13 MED ORDER — HYDROMORPHONE HCL PF 1 MG/ML IJ SOLN
1.0000 mg | Freq: Once | INTRAMUSCULAR | Status: AC
  Administered 2013-02-13: 1 mg via INTRAVENOUS
  Filled 2013-02-13: qty 1

## 2013-02-13 MED ORDER — IOHEXOL 300 MG/ML  SOLN
100.0000 mL | Freq: Once | INTRAMUSCULAR | Status: AC | PRN
  Administered 2013-02-13: 100 mL via INTRAVENOUS

## 2013-02-13 MED ORDER — SODIUM CHLORIDE 0.9 % IV SOLN
Freq: Once | INTRAVENOUS | Status: AC
  Administered 2013-02-13: 15:00:00 via INTRAVENOUS

## 2013-02-13 MED ORDER — METHYLPREDNISOLONE SODIUM SUCC 125 MG IJ SOLR
125.0000 mg | Freq: Once | INTRAMUSCULAR | Status: AC
  Administered 2013-02-13: 125 mg via INTRAVENOUS
  Filled 2013-02-13: qty 2

## 2013-02-13 MED ORDER — PREDNISONE 20 MG PO TABS: 40.0000 mg | ORAL_TABLET | Freq: Every day | ORAL | Status: DC

## 2013-02-13 MED ORDER — OXYCODONE-ACETAMINOPHEN 5-325 MG PO TABS: ORAL_TABLET | ORAL | Status: DC

## 2013-02-13 MED ORDER — ONDANSETRON HCL 4 MG/2ML IJ SOLN
4.0000 mg | Freq: Once | INTRAMUSCULAR | Status: AC
  Administered 2013-02-13: 4 mg via INTRAVENOUS
  Filled 2013-02-13: qty 2

## 2013-02-13 NOTE — ED Notes (Signed)
Pt is here with RLQ pain that started last nite and associated with diarrhea.  Hx of Crohn's.  Pt reports red urine.  LMP is due now.

## 2013-02-13 NOTE — Telephone Encounter (Signed)
Noted  

## 2013-02-13 NOTE — Discharge Instructions (Signed)
 Crohn's Disease Crohn's disease is a long-term (chronic) soreness and redness (inflammation) of the intestines (bowel). It can affect any portion of the digestive tract, from the mouth to the anus. It can also cause problems outside the digestive tract. Crohn's disease is closely related to a disease called ulcerative colitis (together, these two diseases are called inflammatory bowel disease).  CAUSES  The cause of Crohn's disease is not known. One bernadette is that, in an easily affected (susceptible) person, the immune system is triggered to attack the body's own digestive tissue. Crohn's disease runs in families. It seems to be more common in certain geographic areas and amongst certain races. There are no clear-cut dietary causes.  SYMPTOMS  Crohn's disease can cause many different symptoms since it can affect many different parts of the body. Symptoms include:  Fatigue.  Weight loss.  Chronic diarrhea, sometime bloody.  Abdominal pain and cramps.  Fever.  Ulcers or canker sores in the mouth or rectum.  Anemia (low red blood cells).  Arthritis, skin problems, and eye problems may occur. Complications of Crohn's disease can include:  Series of holes (perforation) of the bowel.  Portions of the intestines sticking to each other (adhesions).  Obstruction of the bowel.  Fistula formation, typically in the rectal area but also in other areas. A fistula is an opening between the bowels and the outside, or between the bowels and another organ.  A painful crack in the mucous membrane of the anus (rectal fissure). DIAGNOSIS  Your caregiver may suspect Crohn's disease based on your symptoms and an exam. Blood tests may confirm that there is a problem. You may be asked to submit a stool specimen for examination. X-rays and CT scans may be necessary. Ultimately, the diagnosis is usually made after a procedure that uses a flexible tube that is inserted via your mouth or your anus. This is done  under sedation and is called either an upper endoscopy or colonoscopy. With these tests, the specialist can take tiny tissue samples and remove them from the inside of the bowel (biopsy). Examination of this biopsy tissue under a microscope can reveal Crohn's disease as the cause of your symptoms. Due to the many different forms that Crohn's disease can take, symptoms may be present for several years before a diagnosis is made. HOME CARE INSTRUCTIONS   There is no cure for Crohn's disease. The best treatment is frequent checkups with your caregiver.  Symptoms such as diarrhea can be controlled with medications. Avoid foods that have a laxative effect such as fresh fruit, vegetables and dairy products. During flare ups, you can rest your bowel by refraining from solid foods. Drink clear liquids frequently during the day (electrolyte or re-hydrating fluids are best. Your caregiver can help you with suggestions). Drink often to prevent loss of body fluids (dehydration). When diarrhea has cleared, eat small meals and more frequently. Avoid food additives and stimulants such as caffeine  (coffee, tea, or chocolate). Enzyme supplements may help if you develop intolerance to a sugar in dairy products (lactose). Ask your caregiver or dietitian about specific dietary instructions.  Try to maintain a positive attitude. Learn relaxation techniques such as self hypnosis, mental imaging, and muscle relaxation.  If possible, avoid stresses which can aggravate your condition.  Exercise regularly.  Follow your diet.  Always get plenty of rest. SEEK MEDICAL CARE IF:   Your symptoms fail to improve after a week or two of new treatment.  You experience continued weight loss.  You have  ongoing crampy digestion or loose bowels.  You develop a new skin rash, skin sores, or eye problems. SEEK IMMEDIATE MEDICAL CARE IF:   You have worsening of your symptoms or develop new symptoms.  You have a fever.  You  develop bloody diarrhea.  You develop severe abdominal pain. MAKE SURE YOU:   Understand these instructions.  Will watch your condition.  Will get help right away if you are not doing well or get worse. Document Released: 09/02/2005 Document Revised: 02/15/2012 Document Reviewed: 08/01/2007 [facility] Patient Information 2013 [place], [place].  Narcotic and benzodiazepine use may cause drowsiness, slowed breathing or dependence.  Please use with caution and do not drive, operate machinery or watch young children alone while taking them.  Taking combinations of these medications or drinking alcohol  will potentiate these effects.

## 2013-02-13 NOTE — Telephone Encounter (Signed)
Per phone call pt states she has blood in her urine and abdominal pain. Called pt back and she states that she cannot talk right now, she is on the way to the ER. Pt sounded tearful on the phone. Dr. Arlyce Dice notified.

## 2013-02-13 NOTE — ED Provider Notes (Signed)
History     CSN: [id number]  Arrival date & time 02/13/13  1015   First MD Initiated Contact with Patient 02/13/13 1253      Chief Complaint  Patient presents with  . Abdominal Pain    (Consider location/radiation/quality/duration/timing/severity/associated sxs/prior treatment) HPI Comments: Patient with history of Crohn's disease currently on Imuran and lialda, was on prednisone in tapered in December. She began gradually having worsening pain in the same location as her usual Crohn's flares, on the right side starting in early February. She reports pain got markedly worse last night associated with 2 loose bowel movements, no nausea or vomiting. She has had her gallbladder removed in February of 2012. Her Crohn's physician is Dr. Arlyce Dice with the [facility]. She denies fevers or chills. She denies any unusual vaginal bleeding or discharge and denies dysuria or urinary frequency. She reports she does have an outpatient appointment with GI next week the pain got much worse today and so presented to the emergency department.  Patient is a 22 y.o. female presenting with abdominal pain. The history is provided by the patient and a parent.  Abdominal Pain Associated symptoms: chills and diarrhea   Associated symptoms: no dysuria, no fever, no nausea and no vomiting     Past Medical History  Diagnosis Date  . Gastritis   . 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 Ileitis, probable Crohn's disease 10/11/2012    CT 10/11/2012: Marked inflammatory findings in the terminal ileum and adjacent  cecum, with enlarged pericecal lymph nodes. The appearance favors  Crohn's disease/terminal ileitis, with infectious enterocolitis  less likely given the segmental involvement. No definite  extraluminal gas although there is mild complex ascites in the  pelvis.      Past Surgical History  Procedure Laterality Date  . Laparoscopic cholecystectomy  Feb 2012    Dr. Jamey Ripa for chronic calculus  cholecystitis  . Esophagogastroduodenoscopy    . Colonoscopy  10/12/2012    Procedure: COLONOSCOPY;  Surgeon: Louis Meckel, MD;  Location: [facility] ENDOSCOPY;  Service: Endoscopy;  Laterality: N/A;    Family History  Problem Relation Age of Onset  . Colon cancer Neg Hx     History  Substance Use Topics  . Smoking status: Never Smoker   . Smokeless tobacco: Never Used  . Alcohol Use: No    OB History   Grav Para Term Preterm Abortions TAB SAB Ect Mult Living                  Review of Systems  Constitutional: Positive for chills and appetite change. Negative for fever.  Gastrointestinal: Positive for abdominal pain and diarrhea. Negative for nausea, vomiting and blood in stool.  Genitourinary: Negative for dysuria and flank pain.  Musculoskeletal: Negative for back pain.  All other systems reviewed and are negative.    Allergies  Review of patient's allergies indicates no known allergies.  Home Medications   Current Outpatient Rx  Name  Route  Sig  Dispense  Refill  . azaTHIOprine (IMURAN) 50 MG tablet   Oral   Take 125 mg by mouth daily.         . calcium-vitamin D (OSCAL WITH D) 500-200 MG-UNIT per tablet   Oral   Take 2 tablets by mouth daily.         . mesalamine (LIALDA) 1.2 G EC tablet   Oral   Take 4.8 g by mouth daily with breakfast.         .  oxyCODONE-acetaminophen (PERCOCET/ROXICET) 5-325 MG per tablet      1-2 tablets po q 6 hours prn moderate to severe pain   30 tablet   0   . predniSONE (DELTASONE) 20 MG tablet   Oral   Take 2 tablets (40 mg total) by mouth daily.   14 tablet   0     BP 114/77  Pulse 87  Temp(Src) 97.1 F (36.2 C) (Oral)  Resp 16  SpO2 99%  LMP 01/19/2013  Physical Exam  Nursing note and vitals reviewed. Constitutional: She is oriented to person, place, and time. She appears well-developed and well-nourished. She appears distressed.  HENT:  Head: Normocephalic and atraumatic.  Eyes: EOM are normal. No  scleral icterus.  Neck: Normal range of motion. Neck supple.  Cardiovascular: Normal rate and regular rhythm.   Pulmonary/Chest: Effort normal. No respiratory distress. She has no wheezes.  Abdominal: Soft. She exhibits no distension. There is tenderness. There is guarding. There is no rebound.  Neurological: She is alert and oriented to person, place, and time.  Skin: Skin is warm and dry. No rash noted.    ED Course  Procedures (including critical care time)  Labs Reviewed  CBC WITH DIFFERENTIAL - Abnormal; Notable for the following:    WBC 13.1 (*)    Hemoglobin 10.8 (*)    HCT 31.8 (*)    Neutrophils Relative 84 (*)    Neutro Abs 11.0 (*)    Lymphocytes Relative 7 (*)    All other components within normal limits  COMPREHENSIVE METABOLIC PANEL - Abnormal; Notable for the following:    Albumin 3.0 (*)    All other components within normal limits  URINALYSIS, ROUTINE W REFLEX MICROSCOPIC - Abnormal; Notable for the following:    Specific Gravity, Urine 1.033 (*)    Bilirubin Urine SMALL (*)    Ketones, ur 15 (*)    Leukocytes, UA SMALL (*)    All other components within normal limits  URINE MICROSCOPIC-ADD ON - Abnormal; Notable for the following:    Squamous Epithelial / LPF FEW (*)    Bacteria, UA FEW (*)    All other components within normal limits  URINE CULTURE  LIPASE, BLOOD  POCT PREGNANCY, URINE   Ct Abdomen Pelvis W Contrast  02/13/2013  *RADIOLOGY REPORT*  Clinical Data: Abdominal pain.  Crohn's disease.  CT ABDOMEN AND PELVIS WITH CONTRAST  Technique:  Multidetector CT imaging of the abdomen and pelvis was performed following the standard protocol during bolus administration of intravenous contrast.  Contrast: OMNIPAQUE IOHEXOL 300 MG/ML  SOLN  Comparison: Multiple exams, including 10/11/2012  Findings: Subsegmental atelectasis noted in the posterior basal segments of both lower lobes. The liver, spleen, pancreas, and adrenal glands appear unremarkable.   Gallbladder surgically absent.  The kidneys appear unremarkable, as do the proximal ureters.  Prominent enhancement of the terminal ileum noted with wall thickening and indistinctness of the terminal ileum wall.  Orally administered contrast is confined to the proximal small bowel. Small to moderate but abnormal amount of free pelvic fluid is again observed.  Adjacent stranding in the small bowel mesentery noted with scattered pericecal lymph nodes.  The appendix is not well seen.  A similar appearance of the left ovarian dermoid with fatty and calcific elements.  Uterus and urinary bladder unremarkable.  No definite abscess observed.  IMPRESSION:  1.  Prominent wall thickening and inflammatory stranding around the distal ileum, favoring active Crohn's disease.  No definite abscess. 2.  Small  to moderate amount of free pelvic fluid. 3.  Stable appearance of left ovarian dermoid.   Original Report Authenticated By: Gaylyn Rong, M.D.      1. Exacerbation of Crohn's disease     ra sat is 98% and I interpret to be normal  Pt felt improved, but still in pain, given additional dose of dilaudid prior to discharge to home.  All questions answered and pt and family agreeable to plan.   MDM  Pt with h/o Crohn's, recently tapered off of steroids.  PT also with h/o dermoid cyst on ovaries.  Still has appendix.  Pt is afebrile, but sig tenderness in RLQ.  Unable to clinically rule out appendicitis, esp with elevated WBC.  CT of abd/pelvis obtained showing likely Crohn's flare.  Spoke to Dr. Marina Goodell who recommends prednisone 40 mg daily and to follow up in office.  Pt has already scheduled appt on Monday.          Gavin Pound. Kayson Bullis, MD 02/14/13 1521

## 2013-02-13 NOTE — ED Notes (Signed)
Pt has finished contrast, CT is aware

## 2013-02-14 LAB — URINE CULTURE
Colony Count: NO GROWTH
Culture: NO GROWTH

## 2013-02-15 ENCOUNTER — Telehealth: Payer: Self-pay | Admitting: Gastroenterology

## 2013-02-15 NOTE — Telephone Encounter (Signed)
Pt states she was given oxycodone in the ER the other day for pain and some prednisone. Pt states she took one of the pain pills but it did not help. Asked pt what the directions on the bottle said and it states may take 1-2 every 6 hours for pain as needed. Instructed pt to try taking 2 of the pills and if the pain still is not relieved we may need to bring her in sooner than Monday. Pt verbalized understanding.

## 2013-02-20 ENCOUNTER — Ambulatory Visit (INDEPENDENT_AMBULATORY_CARE_PROVIDER_SITE_OTHER): Payer: Managed Care, Other (non HMO) | Admitting: Gastroenterology

## 2013-02-20 ENCOUNTER — Encounter: Payer: Self-pay | Admitting: Gastroenterology

## 2013-02-20 VITALS — BP 118/80 | HR 83 | Ht 66.5 in

## 2013-02-20 DIAGNOSIS — K5000 Crohn's disease of small intestine without complications: Secondary | ICD-10-CM

## 2013-02-20 MED ORDER — MESALAMINE 1.2 G PO TBEC: 4.8000 g | DELAYED_RELEASE_TABLET | Freq: Every day | ORAL | Status: DC

## 2013-02-20 NOTE — Patient Instructions (Addendum)
Dr Arlyce Dice has advised that you be on a prednisone taper. The taper instructions are as follows:  Prednisone 30mg  for 7 days Prednisone 20mg  for 7 days Prednisone 15mg  for 7 days Prednisone 10mg  for 7 days Prednisone 5 mg for 7 days Then 10mg  alternating with 10mg  for 7 days Then 5 mg for 7 days Then discontinue  Follow up in 6 weeks

## 2013-02-20 NOTE — Progress Notes (Signed)
History of Present Illness:  Ms. Granlund has returned for followup of Crohn's disease. She has Crohn's involving the terminal ileum and the right colon. She had been on a prednisone taper but became symptomatic with pain within a week of stopping prednisone. She was seen in the ED on March 10 where CT demonstrated enhancement of the terminal ileum with thickening. Changes were consistent with active Crohn's disease. Since resuming prednisone 40 mg daily on March 10 pain has subsided. She denies diarrhea or bleeding. She remains on lialda and Imuran    Review of Systems: Pertinent positive and negative review of systems were noted in the above HPI section. All other review of systems were otherwise negative.    Current Medications, Allergies, Past Medical History, Past Surgical History, Family History and Social History were reviewed in [employer] electronic medical record  Vital signs were reviewed in today's medical record. Physical Exam: General: Well developed , well nourished, no acute distress

## 2013-02-20 NOTE — Assessment & Plan Note (Signed)
Very recent flare after slow steroid taper. Plan to resume tapering prednisone on a weekly basis. Should she become symptomatic with taper then I will start her on biologic therapy including either Remicade or Humira. She'll continue her other medications.

## 2013-02-27 ENCOUNTER — Telehealth: Payer: Self-pay | Admitting: Gastroenterology

## 2013-02-27 ENCOUNTER — Other Ambulatory Visit: Payer: Self-pay | Admitting: *Deleted

## 2013-02-27 MED ORDER — PREDNISONE 20 MG PO TABS: 40.0000 mg | ORAL_TABLET | Freq: Every day | ORAL | Status: DC

## 2013-02-27 MED ORDER — PREDNISONE 20 MG PO TABS: ORAL_TABLET | ORAL | Status: DC

## 2013-02-27 NOTE — Telephone Encounter (Signed)
Called pt to inform med sent to pharmacy.............she is in class left her a message

## 2013-02-28 ENCOUNTER — Encounter (HOSPITAL_COMMUNITY): Payer: Self-pay

## 2013-02-28 ENCOUNTER — Emergency Department (HOSPITAL_COMMUNITY): Payer: Managed Care, Other (non HMO)

## 2013-02-28 ENCOUNTER — Emergency Department (HOSPITAL_COMMUNITY)
Admission: EM | Admit: 2013-02-28 | Discharge: 2013-02-28 | Disposition: A | Discharge status: Home / Self Care | Payer: Managed Care, Other (non HMO) | Attending: Emergency Medicine | Admitting: Emergency Medicine

## 2013-02-28 DIAGNOSIS — M25519 Pain in unspecified shoulder: Secondary | ICD-10-CM | POA: Insufficient documentation

## 2013-02-28 DIAGNOSIS — Z79899 Other long term (current) drug therapy: Secondary | ICD-10-CM | POA: Insufficient documentation

## 2013-02-28 DIAGNOSIS — Y92410 Unspecified street and highway as the place of occurrence of the external cause: Secondary | ICD-10-CM | POA: Insufficient documentation

## 2013-02-28 DIAGNOSIS — S0990XA Unspecified injury of head, initial encounter: Secondary | ICD-10-CM | POA: Insufficient documentation

## 2013-02-28 DIAGNOSIS — Y9389 Activity, other specified: Secondary | ICD-10-CM | POA: Insufficient documentation

## 2013-02-28 DIAGNOSIS — K5000 Crohn's disease of small intestine without complications: Secondary | ICD-10-CM | POA: Insufficient documentation

## 2013-02-28 DIAGNOSIS — Z8719 Personal history of other diseases of the digestive system: Secondary | ICD-10-CM | POA: Insufficient documentation

## 2013-02-28 DIAGNOSIS — R42 Dizziness and giddiness: Secondary | ICD-10-CM | POA: Insufficient documentation

## 2013-02-28 DIAGNOSIS — R51 Headache: Secondary | ICD-10-CM

## 2013-02-28 DIAGNOSIS — Z8742 Personal history of other diseases of the female genital tract: Secondary | ICD-10-CM | POA: Insufficient documentation

## 2013-02-28 MED ORDER — DIAZEPAM 5 MG PO TABS: 5.0000 mg | ORAL_TABLET | Freq: Three times a day (TID) | ORAL | Status: DC | PRN

## 2013-02-28 MED ORDER — IBUPROFEN 800 MG PO TABS: 800.0000 mg | ORAL_TABLET | Freq: Three times a day (TID) | ORAL | Status: DC | PRN

## 2013-02-28 MED ORDER — ACETAMINOPHEN 325 MG PO TABS
650.0000 mg | ORAL_TABLET | Freq: Once | ORAL | Status: AC
  Administered 2013-02-28: 650 mg via ORAL
  Filled 2013-02-28: qty 2
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
## 2013-03-01 NOTE — ED Provider Notes (Signed)
Medical screening examination/treatment/procedure(s) were performed by non-physician practitioner and as supervising physician I was immediately available for consultation/collaboration.   Ajai Harville L Anyla Israelson, MD 03/01/13 2245 

## 2013-03-16 ENCOUNTER — Telehealth: Payer: Self-pay | Admitting: Gastroenterology

## 2013-03-17 MED ORDER — OXYCODONE-ACETAMINOPHEN 5-325 MG PO TABS: 1.0000 | ORAL_TABLET | ORAL | Status: DC | PRN

## 2013-03-17 NOTE — Telephone Encounter (Signed)
Spoke with patient and scheduled office visit with Dr. Arlyce Dice for Monday 03-20-13 at 9:15am.  Patient wanted help managing her pain through the weekend so I consulted with Mike Gip who agreed to give her Percocet 5-325.  I printed prescription and told her she could pick it up.  Patient agreed

## 2013-03-20 ENCOUNTER — Encounter: Payer: Self-pay | Admitting: Gastroenterology

## 2013-03-20 ENCOUNTER — Ambulatory Visit (INDEPENDENT_AMBULATORY_CARE_PROVIDER_SITE_OTHER): Payer: Managed Care, Other (non HMO) | Admitting: Gastroenterology

## 2013-03-20 VITALS — BP 116/80 | HR 80 | Ht 66.5 in | Wt 179.2 lb

## 2013-03-20 DIAGNOSIS — K5000 Crohn's disease of small intestine without complications: Secondary | ICD-10-CM

## 2013-03-20 MED ORDER — ADALIMUMAB 40 MG/0.8ML ~~LOC~~ KIT: PACK | SUBCUTANEOUS | Status: DC

## 2013-03-20 NOTE — Assessment & Plan Note (Signed)
Patient has recurrent symptoms with tapering of steroids. At this point she seems to be failing immunotherapy along. As previously discussed, I believe that she can benefit from biological therapy. 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 Risks including higher incidence of opportunistic infection and neoplasms were discussed  The patient #1 check PPD #2 increase prednisone to 40 mg a day #3 begin Humira provided that PPD is negative

## 2013-03-20 NOTE — Progress Notes (Signed)
History of Present Illness: Sarah Morrison has returned for followup of Crohn's disease. Upon lowering prednisone from 15-10 mg she has developed recurrent severe lower abdominal pain. Over the past 2 days she's had multiple loose stools as well. She remains on Imuran and lialda.    Past Medical History  Diagnosis Date  . Gastritis   . 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 Ileitis, probable Crohn's disease 10/11/2012    CT 10/11/2012: Marked inflammatory findings in the terminal ileum and adjacent  cecum, with enlarged pericecal lymph nodes. The appearance favors  Crohn's disease/terminal ileitis, with infectious enterocolitis  less likely given the segmental involvement. No definite  extraluminal gas although there is mild complex ascites in the  pelvis.     Past Surgical History  Procedure Laterality Date  . 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 Esophagogastroduodenoscopy    . Colonoscopy  10/12/2012    Procedure: COLONOSCOPY;  Surgeon: Louis Meckel, MD;  Location: [facility] ENDOSCOPY;  Service: Endoscopy;  Laterality: N/A;   family history is negative for Colon cancer. Current Outpatient Prescriptions  Medication Sig Dispense Refill  . azaTHIOprine (IMURAN) 50 MG tablet Take 125 mg by mouth daily.      . mesalamine (LIALDA) 1.2 G EC tablet Take 4 tablets (4.8 g total) by mouth daily with breakfast.  120 tablet  11  . oxyCODONE-acetaminophen (ROXICET) 5-325 MG per tablet Take 1 tablet by mouth every 4 (four) hours as needed for pain.  30 tablet  0  . predniSONE (DELTASONE) 10 MG tablet Take 30 mg by mouth daily. On a taper dose to be weaned off.       No current facility-administered medications for this visit.   Allergies as of 03/20/2013  . (No Known Allergies)    reports that she has never smoked. She has never used smokeless tobacco. She reports that she does not drink alcohol or use illicit drugs.     Review of  Systems: Pertinent positive and negative review of systems were noted in the above HPI section. All other review of systems were otherwise negative.  Vital signs were reviewed in today's medical record Physical Exam: General: Well developed , well nourished, in mild distress secondary to abdominal pain On abdominal exam there is mild tenderness in the right lower quadrant without guarding or rebound

## 2013-03-20 NOTE — Patient Instructions (Addendum)
You are getting a PPD done today You will need to come back and have it read in 48 hours You will began [facility] as soon as your test results from your PPD come back Follow up in 2 weeks  Increase your Prednisone to 40mg  daily If abdominal pain is better decrease to 30 mg

## 2013-03-27 ENCOUNTER — Telehealth: Payer: Self-pay | Admitting: Gastroenterology

## 2013-03-27 NOTE — Telephone Encounter (Signed)
Melford Aase started working on this prior [place]. But coulnt get it finished i think

## 2013-03-28 NOTE — Telephone Encounter (Signed)
Prior auth form completed and faxed. Left message for Trula Ore to call back.

## 2013-03-29 NOTE — Telephone Encounter (Signed)
Spoke with Sarah Morrison and the pt has been approved for Humira for 24 mths.

## 2013-03-29 NOTE — Telephone Encounter (Signed)
Left message for pt to call back  °

## 2013-03-30 ENCOUNTER — Ambulatory Visit
Admission: RE | Admit: 2013-03-30 | Discharge: 2013-03-30 | Disposition: A | Discharge status: Home / Self Care | Payer: Managed Care, Other (non HMO) | Source: Ambulatory Visit | Attending: Gastroenterology | Admitting: Gastroenterology

## 2013-03-30 ENCOUNTER — Telehealth: Payer: Self-pay | Admitting: Gastroenterology

## 2013-03-30 ENCOUNTER — Other Ambulatory Visit: Payer: Self-pay | Admitting: Gastroenterology

## 2013-03-30 DIAGNOSIS — K5090 Crohn's disease, unspecified, without complications: Secondary | ICD-10-CM

## 2013-03-30 DIAGNOSIS — R197 Diarrhea, unspecified: Secondary | ICD-10-CM

## 2013-03-30 DIAGNOSIS — R1031 Right lower quadrant pain: Secondary | ICD-10-CM

## 2013-03-30 MED ORDER — IOHEXOL 300 MG/ML  SOLN
100.0000 mL | Freq: Once | INTRAMUSCULAR | Status: AC | PRN
  Administered 2013-03-30: 100 mL via INTRAVENOUS

## 2013-03-30 MED ORDER — ADALIMUMAB 40 MG/0.8ML ~~LOC~~ KIT: PACK | SUBCUTANEOUS | Status: DC

## 2013-03-30 NOTE — Telephone Encounter (Signed)
Pt will have to get her Humira from [facility] pharmacy. Rx faxed to [phone]. Prior auth letter also faxed to that number.

## 2013-03-31 ENCOUNTER — Emergency Department (HOSPITAL_COMMUNITY)
Admission: EM | Admit: 2013-03-31 | Discharge: 2013-03-31 | Disposition: A | Discharge status: Home / Self Care | Payer: Managed Care, Other (non HMO) | Attending: Emergency Medicine | Admitting: Emergency Medicine

## 2013-03-31 ENCOUNTER — Encounter (HOSPITAL_COMMUNITY): Payer: Self-pay | Admitting: *Deleted

## 2013-03-31 DIAGNOSIS — Z8742 Personal history of other diseases of the female genital tract: Secondary | ICD-10-CM | POA: Insufficient documentation

## 2013-03-31 DIAGNOSIS — Z3202 Encounter for pregnancy test, result negative: Secondary | ICD-10-CM | POA: Insufficient documentation

## 2013-03-31 DIAGNOSIS — R1031 Right lower quadrant pain: Secondary | ICD-10-CM

## 2013-03-31 DIAGNOSIS — Z8719 Personal history of other diseases of the digestive system: Secondary | ICD-10-CM | POA: Insufficient documentation

## 2013-03-31 DIAGNOSIS — IMO0002 Reserved for concepts with insufficient information to code with codable children: Secondary | ICD-10-CM | POA: Insufficient documentation

## 2013-03-31 DIAGNOSIS — Z79899 Other long term (current) drug therapy: Secondary | ICD-10-CM | POA: Insufficient documentation

## 2013-03-31 DIAGNOSIS — R112 Nausea with vomiting, unspecified: Secondary | ICD-10-CM

## 2013-03-31 DIAGNOSIS — Z9089 Acquired absence of other organs: Secondary | ICD-10-CM | POA: Insufficient documentation

## 2013-03-31 DIAGNOSIS — K5000 Crohn's disease of small intestine without complications: Secondary | ICD-10-CM

## 2013-03-31 DIAGNOSIS — R197 Diarrhea, unspecified: Secondary | ICD-10-CM

## 2013-03-31 LAB — URINALYSIS, ROUTINE W REFLEX MICROSCOPIC
Bilirubin Urine: NEGATIVE
Glucose, UA: NEGATIVE mg/dL
Hgb urine dipstick: NEGATIVE
Nitrite: NEGATIVE
Specific Gravity, Urine: 1.045 — ABNORMAL HIGH (ref 1.005–1.030)
pH: 6 (ref 5.0–8.0)

## 2013-03-31 LAB — COMPREHENSIVE METABOLIC PANEL
ALT: 25 U/L (ref 0–35)
Alkaline Phosphatase: 76 U/L (ref 39–117)
CO2: 26 mEq/L (ref 19–32)
Calcium: 8.9 mg/dL (ref 8.4–10.5)
GFR calc Af Amer: >90 mL/min (ref >90)
GFR calc non Af Amer: >90 mL/min (ref >90)
Glucose, Bld: 127 mg/dL — ABNORMAL HIGH (ref 70–99)
Sodium: 138 mEq/L (ref 135–145)
Total Bilirubin: 0.2 mg/dL — ABNORMAL LOW (ref 0.3–1.2)

## 2013-03-31 LAB — CBC WITH DIFFERENTIAL/PLATELET
Eosinophils Relative: 1 % (ref 0–5)
HCT: 32.4 % — ABNORMAL LOW (ref 36.0–46.0)
Lymphocytes Relative: 19 % (ref 12–46)
Lymphs Abs: 2.2 10*3/uL (ref 0.7–4.0)
MCV: 79 fL (ref 78.0–100.0)
Platelets: 295 10*3/uL (ref 150–400)
RBC: 4.1 MIL/uL (ref 3.87–5.11)
WBC: 11.6 10*3/uL — ABNORMAL HIGH (ref 4.0–10.5)

## 2013-03-31 LAB — URINE MICROSCOPIC-ADD ON

## 2013-03-31 MED ORDER — MESALAMINE 1.2 G PO TBEC
4.8000 g | DELAYED_RELEASE_TABLET | Freq: Once | ORAL | Status: AC
  Administered 2013-03-31: 4.8 g via ORAL
  Filled 2013-03-31: qty 4

## 2013-03-31 MED ORDER — ONDANSETRON HCL 4 MG/2ML IJ SOLN
4.0000 mg | Freq: Once | INTRAMUSCULAR | Status: AC
  Administered 2013-03-31: 4 mg via INTRAVENOUS
  Filled 2013-03-31: qty 2

## 2013-03-31 MED ORDER — PREDNISONE 20 MG PO TABS
40.0000 mg | ORAL_TABLET | Freq: Once | ORAL | Status: AC
  Administered 2013-03-31: 40 mg via ORAL
  Filled 2013-03-31: qty 2

## 2013-03-31 MED ORDER — LACTATED RINGERS IV BOLUS (SEPSIS)
1000.0000 mL | Freq: Once | INTRAVENOUS | Status: AC
  Administered 2013-03-31: 1000 mL via INTRAVENOUS

## 2013-03-31 MED ORDER — HYDROMORPHONE HCL PF 1 MG/ML IJ SOLN
1.0000 mg | INTRAMUSCULAR | Status: AC
  Administered 2013-03-31: 1 mg via INTRAVENOUS
  Filled 2013-03-31: qty 1

## 2013-03-31 MED ORDER — OXYCODONE-ACETAMINOPHEN 5-325 MG PO TABS: 1.0000 | ORAL_TABLET | Freq: Four times a day (QID) | ORAL | Status: DC | PRN

## 2013-03-31 NOTE — ED Notes (Signed)
The pt was seen today by her regular doctor had blood work and a c-t scan for the abd pain

## 2013-03-31 NOTE — ED Provider Notes (Signed)
History     CSN: [id number]  Arrival date & time 03/31/13  0019   First MD Initiated Contact with Patient 03/31/13 [phone]      Chief Complaint  Patient presents with  . Crohn's Disease    (Consider location/radiation/quality/duration/timing/severity/associated sxs/prior treatment) HPI Sarah Morrison is a 22 y.o. female with a history of ileitis likely Crohn's disease followed by gastroenterology who presents with abdominal pain.  Pain is diffuse but somewhat worse in the right lower quadrant, she's had associated diarrhea 3 times today it has been watery without blood. She's had some nausea today, vomiting x2 yesterday.  Pain is crampy, feels like prior Crohn's attacks. She says every time she is weaned off of her steroids she has pain similar to this.  She was weaned off steroids on Monday, the pain started on Wednesday and has progressively gotten worse to the point where it is moderate to severe today.  Past Medical History  Diagnosis Date  . Gastritis   . Dermoid cyst of ovary, 6.1cm by CT [id number] 10/11/2012  . Ileitis, probable Crohn's disease 10/11/2012    CT 10/11/2012: Marked inflammatory findings in the terminal ileum and adjacent  cecum, with enlarged pericecal lymph nodes. The appearance favors  Crohn's disease/terminal ileitis, with infectious enterocolitis  less likely given the segmental involvement. No definite  extraluminal gas although there is mild complex ascites in the  pelvis.      Past Surgical History  Procedure Laterality Date  . 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 Esophagogastroduodenoscopy    . Colonoscopy  10/12/2012    Procedure: COLONOSCOPY;  Surgeon: Louis Meckel, MD;  Location: [facility] ENDOSCOPY;  Service: Endoscopy;  Laterality: N/A;    Family History  Problem Relation Age of Onset  . Colon cancer Neg Hx     History  Substance Use Topics  . Smoking status: Never Smoker   . Smokeless tobacco:  Never Used  . Alcohol Use: No    OB History   Grav Para Term Preterm Abortions TAB SAB Ect Mult Living                  Review of Systems At least 10pt or greater review of systems completed and are negative except where specified in the HPI.  Allergies  Review of patient's allergies indicates no known allergies.  Home Medications   Current Outpatient Rx  Name  Route  Sig  Dispense  Refill  . azaTHIOprine (IMURAN) 50 MG tablet   Oral   Take 125 mg by mouth daily.         . mesalamine (LIALDA) 1.2 G EC tablet   Oral   Take 4 tablets (4.8 g total) by mouth daily with breakfast.   120 tablet   11   . oxyCODONE-acetaminophen (ROXICET) 5-325 MG per tablet   Oral   Take 1 tablet by mouth every 4 (four) hours as needed for pain.   30 tablet   0   . predniSONE (DELTASONE) 10 MG tablet   Oral   Take 30 mg by mouth daily. On a taper dose to be weaned off.         . adalimumab (HUMIRA PEN STARTER) 40 MG/0.8ML injection      Inject Humira 40mg  SQ every 14 days   2 each   6   . adalimumab (HUMIRA) 40 MG/0.8ML injection      Take 160 mg day one, 80  mg daily 7 then 40 mg day 21 and every 2 weeks thereafter   10 each   5   . adalimumab (HUMIRA) 40 MG/0.8ML injection      Inject Humira 160mg  SQ day 0 Inject Humira 80mg  SQ day 15   6 each   0     BP 126/75  Temp(Src) 97.6 F (36.4 C) (Oral)  Resp 18  SpO2 98%  LMP 03/07/2013  Physical Exam  Nursing notes reviewed.  Electronic medical record reviewed. VITAL SIGNS:   Filed Vitals:   03/31/13 0024 03/31/13 0423  BP: 126/75 119/75  Pulse:  80  Temp: 97.6 F (36.4 C)   TempSrc: Oral   Resp: 18 16  SpO2: 98%    CONSTITUTIONAL: Awake, oriented, appears non-toxic HENT: Atraumatic, normocephalic, oral mucosa pink and moist, airway patent. Nares patent without drainage. External ears normal. EYES: Conjunctiva clear, EOMI, PERRLA NECK: Trachea midline, non-tender, supple CARDIOVASCULAR: Normal heart rate,  Normal rhythm, No murmurs, rubs, gallops PULMONARY/CHEST: Clear to auscultation, no rhonchi, wheezes, or rales. Symmetrical breath sounds. Non-tender. ABDOMINAL: Non-distended, soft, non-tender - no rebound or guarding.  BS normal. NEUROLOGIC: Non-focal, moving all four extremities, no gross sensory or motor deficits. EXTREMITIES: No clubbing, cyanosis, or edema SKIN: Warm, Dry, No erythema, No rash  ED Course  Procedures (including critical care time)  Labs Reviewed  URINALYSIS, ROUTINE W REFLEX MICROSCOPIC - Abnormal; Notable for the following:    Specific Gravity, Urine 1.045 (*)    Leukocytes, UA TRACE (*)    All other components within normal limits  CBC WITH DIFFERENTIAL - Abnormal; Notable for the following:    WBC 11.6 (*)    Hemoglobin 11.2 (*)    HCT 32.4 (*)    Neutro Abs 8.7 (*)    All other components within normal limits  COMPREHENSIVE METABOLIC PANEL - Abnormal; Notable for the following:    Potassium 3.2 (*)    Glucose, Bld 127 (*)    Albumin 3.2 (*)    Total Bilirubin 0.2 (*)    All other components within normal limits  URINE MICROSCOPIC-ADD ON - Abnormal; Notable for the following:    Squamous Epithelial / LPF FEW (*)    All other components within normal limits  PREGNANCY, URINE   Ct Abdomen Pelvis W Contrast  03/30/2013  *RADIOLOGY REPORT*  Clinical Data: Abdominal pain.  Diarrhea.  History of Crohn disease.  CT ABDOMEN AND PELVIS WITH CONTRAST  Technique:  Multidetector CT imaging of the abdomen and pelvis was performed following the standard protocol during bolus administration of intravenous contrast.  Contrast: OMNIPAQUE IOHEXOL 300 MG/ML  SOLN  Comparison: CT of the abdomen and pelvis 02/13/2013.  Findings:  Lung Bases: Unremarkable.  Abdomen/Pelvis:  Status post cholecystectomy.  The appearance of the liver, pancreas, spleen, bilateral adrenal glands and bilateral kidneys is unremarkable.  There is a small volume of free fluid the cul-de-sac which is  decreased compared to the prior study.  There is some thickening of the wall of the terminal ileum, and some surrounding inflammatory changes, presumably indicative of active inflammation in this patient with history of Crohn disease.  Uterus and right ovary are unremarkable in appearance.  In the left ovary there is again a complex lesion with fatty, soft tissue and calcified components, most compatible with a teratoma.  This measures approximately 6.9 x 4.0 x 5.2 cm and is similar to the prior study.  Urinary bladder is unremarkable in appearance.  Musculoskeletal: There are no aggressive  appearing lytic or blastic lesions noted in the visualized portions of the skeleton.  IMPRESSION: 1.  Marked thickening of the distal ileum with surrounding inflammatory changes and small volume of reactive free fluid the pelvis.  Findings are indicative of an exacerbation of the patient's known Crohn disease. 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 3.  Status post cholecystectomy.   Original Report Authenticated By: Trudie Reed, M.D.      1. Crohn's disease of ileum   2. Abdominal pain, acute, right lower quadrant   3. Nausea and vomiting   4. Diarrhea       MDM  Patient presents with likely Crohn's exacerbation, will treat pain and start patient back on steroids. Patient had discussed with her gastroenterologist about keeping her on steroids while she is transitioning to a new immunotherapy.  Patient improved with pain medicine, white count is 11.6, she's afebrile, nontoxic in appearance, she has a benign abdomen. I do not suspect appendicitis. We'll discharge the patient home with followup with her gastroenterologist, she is to return for any concerns of focal right lower quadrant pain it worsens suggestive of appendicitis. Patient understands and accepts the medical plan as it's been dictated and her questions have been answered to        Jones Skene, MD 04/04/13 [phone]

## 2013-03-31 NOTE — ED Notes (Signed)
The pt is c/o abd pain with nv and diarrhea all day.  lmp April 1st

## 2013-04-17 ENCOUNTER — Telehealth: Payer: Self-pay | Admitting: Gastroenterology

## 2013-04-17 ENCOUNTER — Ambulatory Visit: Payer: Managed Care, Other (non HMO) | Admitting: Gastroenterology

## 2013-04-17 NOTE — Telephone Encounter (Signed)
Pt called to say she transferred her care to another GI

## 2013-04-23 ENCOUNTER — Encounter (HOSPITAL_COMMUNITY): Payer: Self-pay | Admitting: *Deleted

## 2013-04-23 ENCOUNTER — Emergency Department (HOSPITAL_COMMUNITY)
Admission: EM | Admit: 2013-04-23 | Discharge: 2013-04-24 | Disposition: A | Discharge status: Home / Self Care | Payer: Managed Care, Other (non HMO) | Attending: Emergency Medicine | Admitting: Emergency Medicine

## 2013-04-23 DIAGNOSIS — R1084 Generalized abdominal pain: Secondary | ICD-10-CM | POA: Insufficient documentation

## 2013-04-23 DIAGNOSIS — Z8719 Personal history of other diseases of the digestive system: Secondary | ICD-10-CM | POA: Insufficient documentation

## 2013-04-23 DIAGNOSIS — R112 Nausea with vomiting, unspecified: Secondary | ICD-10-CM | POA: Insufficient documentation

## 2013-04-23 DIAGNOSIS — R197 Diarrhea, unspecified: Secondary | ICD-10-CM | POA: Insufficient documentation

## 2013-04-23 DIAGNOSIS — R109 Unspecified abdominal pain: Secondary | ICD-10-CM

## 2013-04-23 DIAGNOSIS — Z79899 Other long term (current) drug therapy: Secondary | ICD-10-CM | POA: Insufficient documentation

## 2013-04-23 DIAGNOSIS — Z3202 Encounter for pregnancy test, result negative: Secondary | ICD-10-CM | POA: Insufficient documentation

## 2013-04-23 LAB — COMPREHENSIVE METABOLIC PANEL
BUN: 9 mg/dL (ref 6–23)
CO2: 26 mEq/L (ref 19–32)
Calcium: 8.8 mg/dL (ref 8.4–10.5)
Creatinine, Ser: 0.67 mg/dL (ref 0.50–1.10)
GFR calc Af Amer: >90 mL/min (ref >90)
GFR calc non Af Amer: >90 mL/min (ref >90)
Glucose, Bld: 93 mg/dL (ref 70–99)

## 2013-04-23 LAB — CBC WITH DIFFERENTIAL/PLATELET
Basophils Absolute: 0 10*3/uL (ref 0.0–0.1)
Eosinophils Absolute: 0.2 10*3/uL (ref 0.0–0.7)
Lymphocytes Relative: 14 % (ref 12–46)
Lymphs Abs: 1.5 10*3/uL (ref 0.7–4.0)
MCH: 27.5 pg (ref 26.0–34.0)
Neutrophils Relative %: 76 % (ref 43–77)
Platelets: 209 10*3/uL (ref 150–400)
RBC: 4.07 MIL/uL (ref 3.87–5.11)
RDW: 13.6 % (ref 11.5–15.5)
WBC: 10.6 10*3/uL — ABNORMAL HIGH (ref 4.0–10.5)

## 2013-04-23 LAB — URINALYSIS, ROUTINE W REFLEX MICROSCOPIC
Nitrite: NEGATIVE
Protein, ur: NEGATIVE mg/dL
Specific Gravity, Urine: 1.017 (ref 1.005–1.030)
Urobilinogen, UA: 1 mg/dL (ref 0.0–1.0)

## 2013-04-23 MED ORDER — SODIUM CHLORIDE 0.9 % IV BOLUS (SEPSIS)
1000.0000 mL | Freq: Once | INTRAVENOUS | Status: AC
  Administered 2013-04-23: 1000 mL via INTRAVENOUS

## 2013-04-23 MED ORDER — HYDROMORPHONE HCL PF 1 MG/ML IJ SOLN
1.0000 mg | Freq: Once | INTRAMUSCULAR | Status: AC
  Administered 2013-04-23: 1 mg via INTRAVENOUS
  Filled 2013-04-23: qty 1

## 2013-04-23 MED ORDER — ONDANSETRON HCL 4 MG/2ML IJ SOLN
4.0000 mg | Freq: Once | INTRAMUSCULAR | Status: AC
  Administered 2013-04-23: 4 mg via INTRAVENOUS
  Filled 2013-04-23: qty 2

## 2013-04-23 NOTE — ED Provider Notes (Signed)
History     CSN: [id number]  Arrival date & time 04/23/13  2100   First MD Initiated Contact with Patient 04/23/13 2132      Chief Complaint  Patient presents with  . Abdominal Pain  . Emesis    (Consider location/radiation/quality/duration/timing/severity/associated sxs/prior treatment) HPI Comments: Sarah Morrison is a 22 y.o. female with a history of ileitis likely Crohn's disease followed by gastroenterology who presents with abdominal pain.  Pain is diffuse but somewhat worse in the right lower quadrant, she's had associated diarrhea 1 time today it has been watery without blood. She's had some nausea today and has vomited 2-3 times.  She denies any blood in her emesis.  Pain is crampy, feels like prior Crohn's attacks. She says every time she is  off of her steroids she has pain similar to this.  She has not had Prednisone in the past week.  She reports that she is waiting for the medication to come in the mail.  She denies fever or chills.  Denies urinary symptoms.  The history is provided by the patient.    Past Medical History  Diagnosis Date  . Gastritis   . Dermoid cyst of ovary, 6.1cm by CT [id number] 10/11/2012  . Ileitis, probable Crohn's disease 10/11/2012    CT 10/11/2012: Marked inflammatory findings in the terminal ileum and adjacent  cecum, with enlarged pericecal lymph nodes. The appearance favors  Crohn's disease/terminal ileitis, with infectious enterocolitis  less likely given the segmental involvement. No definite  extraluminal gas although there is mild complex ascites in the  pelvis.      Past Surgical History  Procedure Laterality Date  . 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 Esophagogastroduodenoscopy    . Colonoscopy  10/12/2012    Procedure: COLONOSCOPY;  Surgeon: Louis Meckel, MD;  Location: [facility] ENDOSCOPY;  Service: Endoscopy;  Laterality: N/A;    Family History  Problem Relation Age of Onset  .  Colon cancer Neg Hx     History  Substance Use Topics  . Smoking status: Never Smoker   . Smokeless tobacco: Never Used  . Alcohol Use: No    OB History   Grav Para Term Preterm Abortions TAB SAB Ect Mult Living                  Review of Systems  Constitutional: Negative for fever and chills.  Gastrointestinal: Positive for nausea, vomiting, abdominal pain and diarrhea. Negative for blood in stool and abdominal distention.  All other systems reviewed and are negative.    Allergies  Review of patient's allergies indicates no known allergies.  Home Medications   Current Outpatient Rx  Name  Route  Sig  Dispense  Refill  . adalimumab (HUMIRA PEN STARTER) 40 MG/0.8ML injection      Inject Humira 40mg  SQ every 14 days   2 each   6   . adalimumab (HUMIRA) 40 MG/0.8ML injection      Take 160 mg day one, 80 mg daily 7 then 40 mg day 21 and every 2 weeks thereafter   10 each   5   . adalimumab (HUMIRA) 40 MG/0.8ML injection      Inject Humira 160mg  SQ day 0 Inject Humira 80mg  SQ day 15   6 each   0   . azaTHIOprine (IMURAN) 50 MG tablet   Oral   Take 125 mg by mouth daily.         Marland Kitchen  mesalamine (LIALDA) 1.2 G EC tablet   Oral   Take 4 tablets (4.8 g total) by mouth daily with breakfast.   120 tablet   11   . oxyCODONE-acetaminophen (PERCOCET/ROXICET) 5-325 MG per tablet   Oral   Take 1-2 tablets by mouth every 6 (six) hours as needed for pain.   23 tablet   0   . oxyCODONE-acetaminophen (ROXICET) 5-325 MG per tablet   Oral   Take 1 tablet by mouth every 4 (four) hours as needed for pain.   30 tablet   0   . predniSONE (DELTASONE) 10 MG tablet   Oral   Take 30 mg by mouth daily. On a taper dose to be weaned off.           BP 133/88  Pulse 108  Temp(Src) 98.7 F (37.1 C) (Oral)  Resp 16  SpO2 99%  LMP 04/17/2013  Physical Exam  Nursing note and vitals reviewed. Constitutional: She appears well-developed and well-nourished.  HENT:   Head: Normocephalic and atraumatic.  Mouth/Throat: Oropharynx is clear and moist.  Neck: Normal range of motion. Neck supple.  Cardiovascular: Normal rate, regular rhythm and normal heart sounds.   Pulmonary/Chest: Effort normal and breath sounds normal.  Abdominal: Soft. Bowel sounds are normal. She exhibits no distension and no mass. There is tenderness in the right lower quadrant, suprapubic area and left lower quadrant. There is no rigidity, no rebound and no guarding.  Negative Rovsing's Sign  Neurological: She is alert.  Skin: Skin is warm and dry.  Psychiatric: She has a normal mood and affect.    ED Course  Procedures (including critical care time)  Labs Reviewed  COMPREHENSIVE METABOLIC PANEL  URINALYSIS, ROUTINE W REFLEX MICROSCOPIC  CBC WITH DIFFERENTIAL   No results found.   No diagnosis found.  12:13 AM Patient reports that she is feeling much better at this time.  No abdominal tenderness to palpation at this time.  MDM  Patient with a history of Crohn's presents with abdominal pain, nausea, vomiting, and diarrhea.  She reports that symptoms are similar to symptoms that she has had in the past with a Crohn's flare up when she is off of Prednisone.  She reports that she has been off of Prednisone for one week due to delay in receiving mail order prescription.  Symptoms improved during ED course.  Patient is afebrile.  Feel that patient stable for discharged.  Discharged home with prescription for Prednisone and short course of pain medication.  Return precautions given.        Sameena Artus [place], PA-C 04/24/13 2149

## 2013-04-23 NOTE — ED Notes (Addendum)
C/o abd pain & nvd. pain onset 1400, vomiting onset 1700. h/o crohns, relates sx to crohns. Restless with pain, actively vomiting. Parents x2 at [facility].

## 2013-04-24 LAB — POCT PREGNANCY, URINE: Preg Test, Ur: NEGATIVE

## 2013-04-24 MED ORDER — PREDNISONE 20 MG PO TABS: 40.0000 mg | ORAL_TABLET | Freq: Every day | ORAL | Status: DC

## 2013-04-24 MED ORDER — OXYCODONE-ACETAMINOPHEN 5-325 MG PO TABS: 1.0000 | ORAL_TABLET | Freq: Four times a day (QID) | ORAL | Status: DC | PRN

## 2013-04-24 MED ORDER — PREDNISONE 20 MG PO TABS
40.0000 mg | ORAL_TABLET | Freq: Once | ORAL | Status: AC
  Administered 2013-04-24: 40 mg via ORAL
  Filled 2013-04-24: qty 2

## 2013-04-24 MED ORDER — ONDANSETRON 4 MG PO TBDP: 4.0000 mg | ORAL_TABLET | Freq: Three times a day (TID) | ORAL | Status: DC | PRN

## 2013-04-25 NOTE — ED Provider Notes (Signed)
Medical screening examination/treatment/procedure(s) were performed by non-physician practitioner and as supervising physician I was immediately available for consultation/collaboration.   Carleene Cooper III, MD 04/25/13 1435

## 2013-05-11 ENCOUNTER — Other Ambulatory Visit (HOSPITAL_COMMUNITY): Payer: Self-pay | Admitting: *Deleted

## 2013-05-12 ENCOUNTER — Encounter (HOSPITAL_COMMUNITY)
Admission: RE | Admit: 2013-05-12 | Discharge: 2013-05-12 | Disposition: A | Discharge status: Home / Self Care | Payer: Managed Care, Other (non HMO) | Source: Ambulatory Visit | Attending: Gastroenterology | Admitting: Gastroenterology

## 2013-05-12 DIAGNOSIS — K5090 Crohn's disease, unspecified, without complications: Secondary | ICD-10-CM | POA: Insufficient documentation

## 2013-05-12 LAB — DIFFERENTIAL
Basophils Relative: 0 % (ref 0–1)
Eosinophils Absolute: 0.1 10*3/uL (ref 0.0–0.7)
Eosinophils Relative: 1 % (ref 0–5)
Lymphs Abs: 2.4 10*3/uL (ref 0.7–4.0)
Monocytes Relative: 7 % (ref 3–12)

## 2013-05-12 LAB — COMPREHENSIVE METABOLIC PANEL
ALT: 17 U/L (ref 0–35)
Albumin: 3 g/dL — ABNORMAL LOW (ref 3.5–5.2)
Alkaline Phosphatase: 56 U/L (ref 39–117)
Chloride: 104 mEq/L (ref 96–112)
GFR calc Af Amer: >90 mL/min (ref >90)
Glucose, Bld: 80 mg/dL (ref 70–99)
Potassium: 3.4 mEq/L — ABNORMAL LOW (ref 3.5–5.1)
Sodium: 140 mEq/L (ref 135–145)
Total Bilirubin: 0.2 mg/dL — ABNORMAL LOW (ref 0.3–1.2)
Total Protein: 7.1 g/dL (ref 6.0–8.3)

## 2013-05-12 LAB — CBC
HCT: 33.5 % — ABNORMAL LOW (ref 36.0–46.0)
Hemoglobin: 11.2 g/dL — ABNORMAL LOW (ref 12.0–15.0)
MCHC: 33.4 g/dL (ref 30.0–36.0)
WBC: 10.3 10*3/uL (ref 4.0–10.5)

## 2013-05-12 MED ORDER — SODIUM CHLORIDE 0.9 % IV SOLN
INTRAVENOUS | Status: DC
  Administered 2013-05-12: 10:00:00 via INTRAVENOUS

## 2013-05-12 MED ORDER — DIPHENHYDRAMINE HCL 50 MG/ML IJ SOLN
INTRAMUSCULAR | Status: AC
  Administered 2013-05-12: 50 mg via INTRAVENOUS
  Filled 2013-05-12: qty 1

## 2013-05-12 MED ORDER — ACETAMINOPHEN 325 MG PO TABS: 650.0000 mg | ORAL_TABLET | ORAL | Status: DC

## 2013-05-12 MED ORDER — DIPHENHYDRAMINE HCL 50 MG/ML IJ SOLN: 50.0000 mg | INTRAMUSCULAR | Status: DC

## 2013-05-12 MED ORDER — ACETAMINOPHEN 325 MG PO TABS
ORAL_TABLET | ORAL | Status: AC
  Administered 2013-05-12: 650 mg
  Filled 2013-05-12: qty 2

## 2013-05-12 MED ORDER — SODIUM CHLORIDE 0.9 % IV SOLN
5.0000 mg/kg | INTRAVENOUS | Status: DC
  Administered 2013-05-12: 400 mg via INTRAVENOUS
  Filled 2013-05-12: qty 40

## 2013-05-25 ENCOUNTER — Other Ambulatory Visit (HOSPITAL_COMMUNITY): Payer: Self-pay | Admitting: *Deleted

## 2013-05-26 ENCOUNTER — Encounter (HOSPITAL_COMMUNITY)
Admission: RE | Admit: 2013-05-26 | Discharge: 2013-05-26 | Disposition: A | Discharge status: Home / Self Care | Payer: Managed Care, Other (non HMO) | Source: Ambulatory Visit | Attending: Gastroenterology | Admitting: Gastroenterology

## 2013-05-26 MED ORDER — SODIUM CHLORIDE 0.9 % IV SOLN
5.0000 mg/kg | INTRAVENOUS | Status: AC
  Administered 2013-05-26: 400 mg via INTRAVENOUS
  Filled 2013-05-26: qty 40

## 2013-05-26 MED ORDER — DIPHENHYDRAMINE HCL 50 MG/ML IJ SOLN: 50.0000 mg | INTRAMUSCULAR | Status: AC

## 2013-05-26 MED ORDER — DIPHENHYDRAMINE HCL 50 MG/ML IJ SOLN
INTRAMUSCULAR | Status: AC
  Administered 2013-05-26: 50 mg via INTRAVENOUS
  Filled 2013-05-26: qty 1

## 2013-05-26 MED ORDER — SODIUM CHLORIDE 0.9 % IV SOLN
INTRAVENOUS | Status: AC
  Administered 2013-05-26: 09:00:00 via INTRAVENOUS

## 2013-05-26 MED ORDER — ACETAMINOPHEN 325 MG PO TABS
ORAL_TABLET | ORAL | Status: AC
  Administered 2013-05-26: 650 mg via ORAL
  Filled 2013-05-26: qty 2

## 2013-05-26 MED ORDER — ACETAMINOPHEN 325 MG PO TABS: 650.0000 mg | ORAL_TABLET | ORAL | Status: AC

## 2013-05-31 ENCOUNTER — Ambulatory Visit
Admission: RE | Admit: 2013-05-31 | Discharge: 2013-05-31 | Disposition: A | Discharge status: Home / Self Care | Payer: Managed Care, Other (non HMO) | Source: Ambulatory Visit | Attending: Obstetrics and Gynecology | Admitting: Obstetrics and Gynecology

## 2013-05-31 ENCOUNTER — Other Ambulatory Visit: Payer: Self-pay | Admitting: Obstetrics and Gynecology

## 2013-05-31 DIAGNOSIS — N838 Other noninflammatory disorders of ovary, fallopian tube and broad ligament: Secondary | ICD-10-CM

## 2013-06-16 ENCOUNTER — Observation Stay (HOSPITAL_COMMUNITY)
Admission: AD | Admit: 2013-06-16 | Discharge: 2013-06-17 | Disposition: A | Discharge status: Home / Self Care | Payer: Managed Care, Other (non HMO) | Source: Ambulatory Visit | Attending: Obstetrics and Gynecology | Admitting: Obstetrics and Gynecology

## 2013-06-16 ENCOUNTER — Encounter (HOSPITAL_COMMUNITY)
Admission: AD | Disposition: A | Discharge status: Home / Self Care | Payer: Self-pay | Source: Ambulatory Visit | Attending: Obstetrics and Gynecology

## 2013-06-16 ENCOUNTER — Encounter (HOSPITAL_COMMUNITY): Payer: Self-pay | Admitting: Anesthesiology

## 2013-06-16 ENCOUNTER — Encounter (HOSPITAL_COMMUNITY): Payer: Self-pay | Admitting: *Deleted

## 2013-06-16 ENCOUNTER — Inpatient Hospital Stay (HOSPITAL_COMMUNITY): Payer: Managed Care, Other (non HMO)

## 2013-06-16 ENCOUNTER — Inpatient Hospital Stay (HOSPITAL_COMMUNITY): Payer: Managed Care, Other (non HMO) | Admitting: Anesthesiology

## 2013-06-16 ENCOUNTER — Ambulatory Visit: Admit: 2013-06-16 | Payer: Self-pay | Admitting: Obstetrics and Gynecology

## 2013-06-16 DIAGNOSIS — D279 Benign neoplasm of unspecified ovary: Principal | ICD-10-CM | POA: Insufficient documentation

## 2013-06-16 DIAGNOSIS — R1032 Left lower quadrant pain: Secondary | ICD-10-CM | POA: Insufficient documentation

## 2013-06-16 DIAGNOSIS — N8353 Torsion of ovary, ovarian pedicle and fallopian tube: Secondary | ICD-10-CM | POA: Insufficient documentation

## 2013-06-16 DIAGNOSIS — N949 Unspecified condition associated with female genital organs and menstrual cycle: Secondary | ICD-10-CM | POA: Insufficient documentation

## 2013-06-16 DIAGNOSIS — K5090 Crohn's disease, unspecified, without complications: Secondary | ICD-10-CM | POA: Insufficient documentation

## 2013-06-16 HISTORY — PX: LAPAROTOMY: SHX154

## 2013-06-16 LAB — COMPREHENSIVE METABOLIC PANEL
Albumin: 3.3 g/dL — ABNORMAL LOW (ref 3.5–5.2)
BUN: 10 mg/dL (ref 6–23)
Calcium: 9.1 mg/dL (ref 8.4–10.5)
Chloride: 100 mEq/L (ref 96–112)
Creatinine, Ser: 0.64 mg/dL (ref 0.50–1.10)
Total Bilirubin: 0.3 mg/dL (ref 0.3–1.2)
Total Protein: 7.2 g/dL (ref 6.0–8.3)

## 2013-06-16 LAB — CBC WITH DIFFERENTIAL/PLATELET
Basophils Absolute: 0 10*3/uL (ref 0.0–0.1)
Basophils Relative: 0 % (ref 0–1)
Eosinophils Absolute: 0.1 10*3/uL (ref 0.0–0.7)
Eosinophils Relative: 1 % (ref 0–5)
HCT: 35.8 % — ABNORMAL LOW (ref 36.0–46.0)
MCH: 27.8 pg (ref 26.0–34.0)
MCHC: 34.4 g/dL (ref 30.0–36.0)
MCV: 81 fL (ref 78.0–100.0)
Monocytes Absolute: 0.7 10*3/uL (ref 0.1–1.0)
RDW: 14.3 % (ref 11.5–15.5)

## 2013-06-16 LAB — WET PREP, GENITAL
Trich, Wet Prep: NONE SEEN
Yeast Wet Prep HPF POC: NONE SEEN

## 2013-06-16 SURGERY — LAPAROTOMY, EXPLORATORY
Anesthesia: General | Site: Abdomen | Wound class: Clean Contaminated

## 2013-06-16 MED ORDER — LACTATED RINGERS IV SOLN
INTRAVENOUS | Status: DC | PRN
  Administered 2013-06-16 (×4): via INTRAVENOUS

## 2013-06-16 MED ORDER — GLYCOPYRROLATE 0.2 MG/ML IJ SOLN
INTRAMUSCULAR | Status: DC | PRN
  Administered 2013-06-16: 0.6 mg via INTRAVENOUS

## 2013-06-16 MED ORDER — FENTANYL CITRATE 0.05 MG/ML IJ SOLN
25.0000 ug | INTRAMUSCULAR | Status: DC | PRN
  Administered 2013-06-17: 25 ug via INTRAVENOUS

## 2013-06-16 MED ORDER — LACTATED RINGERS IV SOLN
INTRAVENOUS | Status: DC
  Administered 2013-06-16: 125 mL/h via INTRAVENOUS

## 2013-06-16 MED ORDER — MEPERIDINE HCL 25 MG/ML IJ SOLN
INTRAMUSCULAR | Status: DC | PRN
  Administered 2013-06-16: 12.5 mg via INTRAVENOUS

## 2013-06-16 MED ORDER — HYDROMORPHONE HCL PF 2 MG/ML IJ SOLN
2.0000 mg | Freq: Once | INTRAMUSCULAR | Status: AC
  Administered 2013-06-16: 2 mg via INTRAMUSCULAR
  Filled 2013-06-16: qty 1

## 2013-06-16 MED ORDER — ONDANSETRON HCL 4 MG/2ML IJ SOLN
INTRAMUSCULAR | Status: DC | PRN
  Administered 2013-06-16: 4 mg via INTRAVENOUS

## 2013-06-16 MED ORDER — FAMOTIDINE IN NACL 20-0.9 MG/50ML-% IV SOLN
INTRAVENOUS | Status: AC
  Administered 2013-06-16: 20 mg via INTRAVENOUS
  Filled 2013-06-16: qty 50

## 2013-06-16 MED ORDER — FENTANYL CITRATE 0.05 MG/ML IJ SOLN
INTRAMUSCULAR | Status: DC | PRN
  Administered 2013-06-16: 100 ug via INTRAVENOUS
  Administered 2013-06-16 (×7): 50 ug via INTRAVENOUS

## 2013-06-16 MED ORDER — LIDOCAINE HCL (CARDIAC) 20 MG/ML IV SOLN
INTRAVENOUS | Status: DC | PRN
  Administered 2013-06-16: 80 mg via INTRAVENOUS

## 2013-06-16 MED ORDER — MIDAZOLAM HCL 5 MG/5ML IJ SOLN
INTRAMUSCULAR | Status: DC | PRN
  Administered 2013-06-16: 2 mg via INTRAVENOUS

## 2013-06-16 MED ORDER — PROMETHAZINE HCL 25 MG/ML IJ SOLN: 6.2500 mg | INTRAMUSCULAR | Status: DC | PRN

## 2013-06-16 MED ORDER — ROCURONIUM BROMIDE 100 MG/10ML IV SOLN
INTRAVENOUS | Status: DC | PRN
  Administered 2013-06-16: 40 mg via INTRAVENOUS

## 2013-06-16 MED ORDER — CEFAZOLIN SODIUM-DEXTROSE 2-3 GM-% IV SOLR
INTRAVENOUS | Status: DC | PRN
  Administered 2013-06-16: 2 g via INTRAVENOUS

## 2013-06-16 MED ORDER — FENTANYL CITRATE 0.05 MG/ML IJ SOLN
INTRAMUSCULAR | Status: AC
  Administered 2013-06-16: 50 ug via INTRAVENOUS
  Filled 2013-06-16: qty 2

## 2013-06-16 MED ORDER — MEPERIDINE HCL 25 MG/ML IJ SOLN: 6.2500 mg | INTRAMUSCULAR | Status: DC | PRN

## 2013-06-16 MED ORDER — FAMOTIDINE IN NACL 20-0.9 MG/50ML-% IV SOLN: 20.0000 mg | Freq: Once | INTRAVENOUS | Status: AC

## 2013-06-16 MED ORDER — NEOSTIGMINE METHYLSULFATE 1 MG/ML IJ SOLN
INTRAMUSCULAR | Status: DC | PRN
  Administered 2013-06-16: 3 mg via INTRAVENOUS

## 2013-06-16 MED ORDER — SUCCINYLCHOLINE CHLORIDE 20 MG/ML IJ SOLN
INTRAMUSCULAR | Status: DC | PRN
  Administered 2013-06-16: 120 mg via INTRAVENOUS

## 2013-06-16 MED ORDER — LACTATED RINGERS IV SOLN: INTRAVENOUS | Status: DC

## 2013-06-16 SURGICAL SUPPLY — 26 items
BARRIER ADHS 3X4 INTERCEED (GAUZE/BANDAGES/DRESSINGS) ×1 IMPLANT
BRR ADH 4X3 ABS CNTRL BYND (GAUZE/BANDAGES/DRESSINGS) ×1
CANISTER SUCTION 2500CC (MISCELLANEOUS) ×2 IMPLANT
CHLORAPREP W/TINT 26ML (MISCELLANEOUS) ×2 IMPLANT
CLOTH BEACON ORANGE TIMEOUT ST (SAFETY) ×2 IMPLANT
CONT PATH 16OZ SNAP LID 3702 (MISCELLANEOUS) ×2 IMPLANT
DRSG OPSITE POSTOP 4X10 (GAUZE/BANDAGES/DRESSINGS) ×1 IMPLANT
GLOVE BIO SURGEON STRL SZ7 (GLOVE) ×3 IMPLANT
GLOVE BIOGEL PI IND STRL 7.0 (GLOVE) ×1 IMPLANT
GLOVE BIOGEL PI INDICATOR 7.0 (GLOVE) ×2
GOWN PREVENTION PLUS LG XLONG (DISPOSABLE) ×6 IMPLANT
NS IRRIG 1000ML POUR BTL (IV SOLUTION) ×6 IMPLANT
PACK ABDOMINAL GYN (CUSTOM PROCEDURE TRAY) ×2 IMPLANT
PAD OB MATERNITY 4.3X12.25 (PERSONAL CARE ITEMS) ×2 IMPLANT
PROTECTOR NERVE ULNAR (MISCELLANEOUS) ×2 IMPLANT
SPONGE LAP 18X18 X RAY DECT (DISPOSABLE) ×4 IMPLANT
SUT CHROMIC 2 0 CT 1 (SUTURE) ×4 IMPLANT
SUT PLAIN 2 0 XLH (SUTURE) IMPLANT
SUT VIC AB 0 CT1 27 (SUTURE)
SUT VIC AB 0 CT1 27XCR 8 STRN (SUTURE) ×4 IMPLANT
SUT VIC AB 0 CT1 36 (SUTURE) ×4 IMPLANT
SUT VIC AB 4-0 KS 27 (SUTURE) ×2 IMPLANT
SUT VICRYL 0 TIES 12 18 (SUTURE) ×2 IMPLANT
TOWEL OR 17X24 6PK STRL BLUE (TOWEL DISPOSABLE) ×4 IMPLANT
TRAY FOLEY CATH 14FR (SET/KITS/TRAYS/PACK) ×2 IMPLANT
WATER STERILE IRR 1000ML POUR (IV SOLUTION) ×2 IMPLANT

## 2013-06-16 NOTE — Brief Op Note (Signed)
06/16/2013  11:20 PM  PATIENT:  Sarah Morrison  22 y.o. female  PRE-OPERATIVE DIAGNOSIS:  Left Ovarian Torsion, dermoid cyst  POST-OPERATIVE DIAGNOSIS:  same  PROCEDURE:  Procedure(s): EXPLORATORY LAPAROTOMY With Removal of Left Ovarian Dermoid Cyst (N/A)  SURGEON:  Surgeon(s) and Role:    * Geryl Rankins, MD - Primary    * Purcell Nails, MD - Assisting  PHYSICIAN ASSISTANT: Dr. Su Hilt  ASSISTANTS: Technician   ANESTHESIA:   general  EBL:  Total I/O In: 1350 [I.V.:1350] Out: 475 [Urine:200; Other:150; Blood:125]  BLOOD ADMINISTERED:none  DRAINS: Urinary Catheter (Foley)   LOCAL MEDICATIONS USED:  NONE  SPECIMEN:  Source of Specimen:  Left ovarian cyst, portion of ovarian stroma  DISPOSITION OF SPECIMEN:  PATHOLOGY  COUNTS:  YES  TOURNIQUET:  * No tourniquets in log *  DICTATION: .Other Dictation: Dictation Number [id number]  PLAN OF CARE: Admit for overnight observation  PATIENT DISPOSITION:  PACU - hemodynamically stable.   Delay start of Pharmacological VTE agent (>24hrs) due to surgical blood loss or risk of bleeding: yes
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
## 2013-06-16 NOTE — Anesthesia Preprocedure Evaluation (Signed)
Anesthesia Evaluation  Patient identified by MRN, date of birth, ID band Patient awake    Reviewed: Allergy & Precautions, H&P , NPO status , Patient's Chart, lab work & pertinent test results  Airway Mallampati: II TM Distance: >3 FB Neck ROM: Full    Dental no notable dental hx.    Pulmonary neg pulmonary ROS,  breath sounds clear to auscultation  Pulmonary exam normal       Cardiovascular negative cardio ROS  Rhythm:Regular Rate:Normal     Neuro/Psych negative neurological ROS  negative psych ROS   GI/Hepatic negative GI ROS, Neg liver ROS,   Endo/Other  negative endocrine ROS  Renal/GU negative Renal ROS  negative genitourinary   Musculoskeletal negative musculoskeletal ROS (+)   Abdominal   Peds negative pediatric ROS (+)  Hematology negative hematology ROS (+)   Anesthesia Other Findings   Reproductive/Obstetrics negative OB ROS                           Anesthesia Physical Anesthesia Plan  ASA: I and emergent  Anesthesia Plan: General   Post-op Pain Management:    Induction: Intravenous, Rapid sequence and Cricoid pressure planned  Airway Management Planned: Oral ETT  Additional Equipment:   Intra-op Plan:   Post-operative Plan: Extubation in OR  Informed Consent: I have reviewed the patients History and Physical, chart, labs and discussed the procedure including the risks, benefits and alternatives for the proposed anesthesia with the patient or authorized representative who has indicated his/her understanding and acceptance.   Dental advisory given  Plan Discussed with: CRNA  Anesthesia Plan Comments:         Anesthesia Quick Evaluation  
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
## 2013-06-16 NOTE — Anesthesia Postprocedure Evaluation (Signed)
  Anesthesia Post-op Note  Patient: Sarah Morrison  Procedure(s) Performed: Procedure(s) (LRB): EXPLORATORY LAPAROTOMY With Removal of Left Ovarian Dermoid Cyst (N/A)  Patient Location: PACU  Anesthesia Type: General  Level of Consciousness: awake and alert   Airway and Oxygen Therapy: Patient Spontanous Breathing  Post-op Pain: mild  Post-op Assessment: Post-op Vital signs reviewed, Patient's Cardiovascular Status Stable, Respiratory Function Stable, Patent Airway and No signs of Nausea or vomiting  Last Vitals:  Filed Vitals:   06/16/13 2315  BP: 129/80  Pulse: 78  Temp:   Resp: 12    Post-op Vital Signs: stable   Complications: No apparent anesthesia complications

## 2013-06-16 NOTE — MAU Note (Signed)
Known dermoid cyst.  Pain started getting worse this afternoon, MD instructed her to come here.

## 2013-06-16 NOTE — Transfer of Care (Signed)
Immediate Anesthesia Transfer of Care Note  Patient: Sarah Morrison  Procedure(s) Performed: Procedure(s): EXPLORATORY LAPAROTOMY (N/A)  Patient Location: PACU  Anesthesia Type:General  Level of Consciousness: awake, alert , oriented and patient cooperative  Airway & Oxygen Therapy: Patient Spontanous Breathing and Patient connected to nasal cannula oxygen  Post-op Assessment: Report given to PACU RN and Post -op Vital signs reviewed and stable  Post vital signs: Reviewed and stable  Complications: No apparent anesthesia complications

## 2013-06-17 LAB — CBC
HCT: 34.9 % — ABNORMAL LOW (ref 36.0–46.0)
Hemoglobin: 11.5 g/dL — ABNORMAL LOW (ref 12.0–15.0)
MCV: 81.5 fL (ref 78.0–100.0)
RBC: 4.28 MIL/uL (ref 3.87–5.11)
WBC: 13.6 10*3/uL — ABNORMAL HIGH (ref 4.0–10.5)

## 2013-06-17 LAB — GC/CHLAMYDIA PROBE AMP: CT Probe RNA: NEGATIVE

## 2013-06-17 MED ORDER — ONDANSETRON HCL 4 MG PO TABS: 4.0000 mg | ORAL_TABLET | Freq: Four times a day (QID) | ORAL | Status: DC | PRN

## 2013-06-17 MED ORDER — DIPHENHYDRAMINE HCL 12.5 MG/5ML PO ELIX
12.5000 mg | ORAL_SOLUTION | Freq: Four times a day (QID) | ORAL | Status: DC | PRN
  Filled 2013-06-17: qty 5

## 2013-06-17 MED ORDER — OXYCODONE-ACETAMINOPHEN 5-325 MG PO TABS: 1.0000 | ORAL_TABLET | ORAL | Status: DC | PRN

## 2013-06-17 MED ORDER — HYDROMORPHONE 0.3 MG/ML IV SOLN
INTRAVENOUS | Status: DC
  Administered 2013-06-17: 3.9 mg via INTRAVENOUS
  Administered 2013-06-17: 02:00:00 via INTRAVENOUS
  Administered 2013-06-17: 0.9 mg via INTRAVENOUS
  Filled 2013-06-17: qty 25

## 2013-06-17 MED ORDER — DIPHENHYDRAMINE HCL 50 MG/ML IJ SOLN: 12.5000 mg | Freq: Four times a day (QID) | INTRAMUSCULAR | Status: DC | PRN

## 2013-06-17 MED ORDER — NALOXONE HCL 0.4 MG/ML IJ SOLN: 0.4000 mg | INTRAMUSCULAR | Status: DC | PRN

## 2013-06-17 MED ORDER — SIMETHICONE 80 MG PO CHEW: 80.0000 mg | CHEWABLE_TABLET | Freq: Four times a day (QID) | ORAL | Status: DC | PRN

## 2013-06-17 MED ORDER — IBUPROFEN 600 MG PO TABS: 600.0000 mg | ORAL_TABLET | Freq: Four times a day (QID) | ORAL | Status: DC | PRN

## 2013-06-17 MED ORDER — ALUM & MAG HYDROXIDE-SIMETH 200-200-20 MG/5ML PO SUSP: 30.0000 mL | ORAL | Status: DC | PRN

## 2013-06-17 MED ORDER — ONDANSETRON HCL 4 MG/2ML IJ SOLN
4.0000 mg | Freq: Four times a day (QID) | INTRAMUSCULAR | Status: DC | PRN
  Administered 2013-06-17 (×2): 4 mg via INTRAVENOUS
  Filled 2013-06-17: qty 2

## 2013-06-17 MED ORDER — MENTHOL 3 MG MT LOZG: 1.0000 | LOZENGE | OROMUCOSAL | Status: DC | PRN

## 2013-06-17 MED ORDER — OXYCODONE-ACETAMINOPHEN 5-325 MG PO TABS
1.0000 | ORAL_TABLET | ORAL | Status: DC | PRN
  Administered 2013-06-17 (×2): 2 via ORAL
  Filled 2013-06-17 (×2): qty 2

## 2013-06-17 MED ORDER — ONDANSETRON HCL 4 MG/2ML IJ SOLN
4.0000 mg | Freq: Four times a day (QID) | INTRAMUSCULAR | Status: DC | PRN
  Filled 2013-06-17: qty 2

## 2013-06-17 MED ORDER — LACTATED RINGERS IV SOLN
INTRAVENOUS | Status: DC
  Administered 2013-06-17: 05:00:00 via INTRAVENOUS

## 2013-06-17 MED ORDER — PREDNISONE 10 MG PO TABS
10.0000 mg | ORAL_TABLET | Freq: Every day | ORAL | Status: DC
  Administered 2013-06-17: 10 mg via ORAL
  Filled 2013-06-17 (×2): qty 1

## 2013-06-17 MED ORDER — HYDROMORPHONE HCL PF 1 MG/ML IJ SOLN: 0.2000 mg | INTRAMUSCULAR | Status: DC | PRN

## 2013-06-17 MED ORDER — SODIUM CHLORIDE 0.9 % IJ SOLN: 9.0000 mL | INTRAMUSCULAR | Status: DC | PRN

## 2013-06-17 MED ORDER — MESALAMINE 1.2 G PO TBEC
4.8000 g | DELAYED_RELEASE_TABLET | Freq: Every day | ORAL | Status: DC
  Administered 2013-06-17: 4.8 g via ORAL
  Filled 2013-06-17 (×2): qty 4
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
## 2013-06-17 NOTE — Discharge Summary (Signed)
Physician Discharge Summary  Patient ID: Sarah Morrison MRN: [id number] DOB/AGE: 1991/04/29 22 y.o.  Admit date: 06/16/2013 Discharge date: 06/17/2013  Admission Diagnoses: OVARIAN PAIN Ovarian Torsion  Discharge Diagnoses: OVARIAN PAIN Ovarian Torsion: s/p ovarian cystectomy  Discharged Condition: good   Treatments: surgery: Exploratory laparotomy for mini-laparotomy with removal with  left ovarian cystectomy, lysis of adhesions  Disposition: 01-Home or Self Care   Future Appointments Provider Department Dept Phone   06/22/2013 12:00 PM [facility] Room 1 [facility] [phone]       Medication List    ASK your doctor about these medications       mesalamine 1.2 G EC tablet  Commonly known as:  LIALDA  Take 4,800 mg by mouth daily with breakfast.     oxyCODONE-acetaminophen 5-325 MG per tablet  Commonly known as:  PERCOCET/ROXICET  Take 1 tablet by mouth every 4 (four) hours as needed for pain.     predniSONE 10 MG tablet  Commonly known as:  DELTASONE  Take 10 mg by mouth daily.           Follow-up Information   Follow up with Geryl Rankins, MD In 2 weeks. (Postop check)    Contact information:   [address], [address] [place] [address] [phone]       Signed: Esmeralda Arthur, MD 06/17/2013, 3:49 PM

## 2013-06-17 NOTE — Progress Notes (Signed)
Patient showered and tolerated well.  Dressing removed from incision.  Incision well approximated with skin glue.  Discharge instructions reviewed with patient.  Patient states understanding of home care, activity, medications, signs/symptoms to report to MD and return MD office visit.  No home equipment needed.  Patient discharged per wheelchair in stable condition with staff without incident.

## 2013-06-17 NOTE — Progress Notes (Signed)
1 Day Post-Op Procedure(s) (LRB): EXPLORATORY LAPAROTOMY With Removal of Left Ovarian Dermoid Cyst (N/A)  Subjective: Patient reports that pain is well managed.  Tolerating normal   diet without difficulty. No nausea / vomiting.  Ambulating and voiding.No flatus yet.  Desires d/c home  Objective: BP 120/72  Pulse 87  Temp(Src) 98.2 F (36.8 C) (Oral)  Resp 18  Ht 5\' 6"  (1.676 m)  Wt 200 lb (90.719 kg)  BMI 32.3 kg/m2  SpO2 97%  LMP 06/11/2013 Lungs: clear Heart: normal rate and rhythm Abdomen:soft and appropriately tender Extremities: Homans sign is negative, no sign of DVT Incision: healing well  Assessment: s/p Procedure(s): EXPLORATORY LAPAROTOMY With Removal of Left Ovarian Dermoid Cyst: progressing well  Plan: Discharge home Discharge instructions reviewed with patient and aunt To follow-up with Dr Dion Body for post-op visit  LOS: 1 day    Sarah Morrison A 06/17/2013, 3:17 PM

## 2013-06-19 ENCOUNTER — Encounter (HOSPITAL_COMMUNITY): Payer: Self-pay | Admitting: Obstetrics and Gynecology

## 2013-06-22 ENCOUNTER — Encounter (HOSPITAL_COMMUNITY)
Admission: RE | Admit: 2013-06-22 | Discharge: 2013-06-22 | Disposition: A | Discharge status: Home / Self Care | Payer: Managed Care, Other (non HMO) | Source: Ambulatory Visit | Attending: Gastroenterology | Admitting: Gastroenterology

## 2013-06-22 DIAGNOSIS — K5090 Crohn's disease, unspecified, without complications: Secondary | ICD-10-CM | POA: Insufficient documentation

## 2013-06-22 MED ORDER — INFLIXIMAB 100 MG IV SOLR: 5.0000 mg/kg | Freq: Once | INTRAVENOUS | Status: DC

## 2013-06-22 MED ORDER — ACETAMINOPHEN 325 MG PO TABS: 650.0000 mg | ORAL_TABLET | Freq: Once | ORAL | Status: DC

## 2013-06-22 MED ORDER — DIPHENHYDRAMINE HCL 50 MG/ML IJ SOLN: 50.0000 mg | Freq: Once | INTRAMUSCULAR | Status: DC

## 2013-06-22 NOTE — Progress Notes (Signed)
Dr Matthias Hughs advised patient had laparotomy 06/16/2013.  Patient advised to reschedule appointment in 7 to 10 days

## 2013-06-30 ENCOUNTER — Encounter (HOSPITAL_COMMUNITY)
Admission: RE | Admit: 2013-06-30 | Discharge: 2013-06-30 | Disposition: A | Discharge status: Home / Self Care | Payer: Managed Care, Other (non HMO) | Source: Ambulatory Visit | Attending: Gastroenterology | Admitting: Gastroenterology

## 2013-06-30 MED ORDER — ACETAMINOPHEN 325 MG PO TABS: 650.0000 mg | ORAL_TABLET | Freq: Once | ORAL | Status: DC

## 2013-06-30 MED ORDER — SODIUM CHLORIDE 0.9 % IV SOLN
5.0000 mg/kg | Freq: Once | INTRAVENOUS | Status: DC
  Filled 2013-06-30: qty 40

## 2013-06-30 MED ORDER — SODIUM CHLORIDE 0.9 % IV SOLN
5.0000 mg/kg | INTRAVENOUS | Status: DC
  Administered 2013-06-30: 400 mg via INTRAVENOUS
  Filled 2013-06-30: qty 40

## 2013-06-30 MED ORDER — SODIUM CHLORIDE 0.9 % IV SOLN
INTRAVENOUS | Status: DC
  Administered 2013-06-30: 11:00:00 via INTRAVENOUS

## 2013-06-30 MED ORDER — DIPHENHYDRAMINE HCL 50 MG/ML IJ SOLN: 50.0000 mg | Freq: Once | INTRAMUSCULAR | Status: DC

## 2013-06-30 MED ORDER — DIPHENHYDRAMINE HCL 50 MG/ML IJ SOLN
INTRAMUSCULAR | Status: AC
  Administered 2013-06-30: 50 mg
  Filled 2013-06-30: qty 1

## 2013-06-30 MED ORDER — SODIUM CHLORIDE 0.9 % IV SOLN: INTRAVENOUS | Status: DC

## 2013-06-30 NOTE — Progress Notes (Signed)
Called to pt's room at 11:00am.  She is c/o feeling extremely fidgety and restless, having a hard time lying in the bed. Dr Marge Duncans office notified.  Return call from [facility] after speaking with Dr Evette Cristal.  Orders received to stop Remicade infusion and to send the pt home. He will discuss with Dr [name] and send new orders next week.  She is to reschedule for next week.

## 2013-07-06 ENCOUNTER — Encounter (HOSPITAL_COMMUNITY): Payer: Managed Care, Other (non HMO)

## 2013-07-06 ENCOUNTER — Other Ambulatory Visit (HOSPITAL_COMMUNITY): Payer: Self-pay | Admitting: *Deleted

## 2013-07-07 ENCOUNTER — Encounter (HOSPITAL_COMMUNITY)
Admission: RE | Admit: 2013-07-07 | Discharge: 2013-07-07 | Disposition: A | Discharge status: Home / Self Care | Payer: Managed Care, Other (non HMO) | Source: Ambulatory Visit | Attending: Gastroenterology | Admitting: Gastroenterology

## 2013-07-07 DIAGNOSIS — K5090 Crohn's disease, unspecified, without complications: Secondary | ICD-10-CM | POA: Insufficient documentation

## 2013-07-07 MED ORDER — ACETAMINOPHEN 325 MG PO TABS
650.0000 mg | ORAL_TABLET | ORAL | Status: DC
  Administered 2013-07-07: 650 mg via ORAL

## 2013-07-07 MED ORDER — ACETAMINOPHEN 325 MG PO TABS
ORAL_TABLET | ORAL | Status: AC
  Filled 2013-07-07: qty 2

## 2013-07-07 MED ORDER — SODIUM CHLORIDE 0.9 % IV SOLN
INTRAVENOUS | Status: DC
  Administered 2013-07-07: 09:00:00 via INTRAVENOUS

## 2013-07-07 MED ORDER — SODIUM CHLORIDE 0.9 % IV SOLN
5.0000 mg/kg | INTRAVENOUS | Status: DC
  Administered 2013-07-07: 400 mg via INTRAVENOUS
  Filled 2013-07-07 (×7): qty 40

## 2013-07-07 MED ORDER — DIPHENHYDRAMINE HCL 50 MG/ML IJ SOLN
INTRAMUSCULAR | Status: AC
  Filled 2013-07-07: qty 1

## 2013-07-07 MED ORDER — DIPHENHYDRAMINE HCL 50 MG/ML IJ SOLN
25.0000 mg | INTRAMUSCULAR | Status: DC
  Administered 2013-07-07: 25 mg via INTRAVENOUS

## 2013-08-31 ENCOUNTER — Other Ambulatory Visit (HOSPITAL_COMMUNITY): Payer: Self-pay | Admitting: *Deleted

## 2013-09-01 ENCOUNTER — Encounter (HOSPITAL_COMMUNITY)
Admission: RE | Admit: 2013-09-01 | Discharge: 2013-09-01 | Disposition: A | Discharge status: Home / Self Care | Payer: Managed Care, Other (non HMO) | Source: Ambulatory Visit | Attending: Gastroenterology | Admitting: Gastroenterology

## 2013-09-01 DIAGNOSIS — K5090 Crohn's disease, unspecified, without complications: Secondary | ICD-10-CM | POA: Insufficient documentation

## 2013-09-01 MED ORDER — ACETAMINOPHEN 325 MG PO TABS
ORAL_TABLET | ORAL | Status: AC
  Administered 2013-09-01: 650 mg via ORAL
  Filled 2013-09-01: qty 2

## 2013-09-01 MED ORDER — DIPHENHYDRAMINE HCL 50 MG/ML IJ SOLN: 25.0000 mg | Freq: Once | INTRAMUSCULAR | Status: AC

## 2013-09-01 MED ORDER — ACETAMINOPHEN 325 MG PO TABS: 650.0000 mg | ORAL_TABLET | Freq: Once | ORAL | Status: AC

## 2013-09-01 MED ORDER — DIPHENHYDRAMINE HCL 50 MG/ML IJ SOLN
INTRAMUSCULAR | Status: AC
  Administered 2013-09-01: 25 mg via INTRAVENOUS
  Filled 2013-09-01: qty 1

## 2013-09-01 MED ORDER — SODIUM CHLORIDE 0.9 % IV SOLN
5.0000 mg/kg | INTRAVENOUS | Status: AC
  Administered 2013-09-01: 400 mg via INTRAVENOUS
  Filled 2013-09-01: qty 40

## 2013-10-17 ENCOUNTER — Ambulatory Visit (INDEPENDENT_AMBULATORY_CARE_PROVIDER_SITE_OTHER): Payer: Managed Care, Other (non HMO) | Admitting: Physician Assistant

## 2013-10-17 VITALS — BP 130/88 | HR 90 | Temp 98.1°F | Resp 18 | Ht 67.0 in | Wt 201.2 lb

## 2013-10-17 DIAGNOSIS — R109 Unspecified abdominal pain: Secondary | ICD-10-CM

## 2013-10-17 DIAGNOSIS — R112 Nausea with vomiting, unspecified: Secondary | ICD-10-CM

## 2013-10-17 DIAGNOSIS — K5010 Crohn's disease of large intestine without complications: Secondary | ICD-10-CM

## 2013-10-17 LAB — POCT CBC
HCT, POC: 39.6 % (ref 37.7–47.9)
Hemoglobin: 12.5 g/dL (ref 12.2–16.2)
Lymph, poc: 2.5 (ref 0.6–3.4)
MCH, POC: 26.5 pg — AB (ref 27–31.2)
MCHC: 31.6 g/dL — AB (ref 31.8–35.4)
WBC: 7.1 10*3/uL (ref 4.6–10.2)

## 2013-10-17 LAB — COMPREHENSIVE METABOLIC PANEL
ALT: 28 U/L (ref 0–35)
Albumin: 4 g/dL (ref 3.5–5.2)
BUN: 5 mg/dL — ABNORMAL LOW (ref 6–23)
CO2: 23 mEq/L (ref 19–32)
Calcium: 9 mg/dL (ref 8.4–10.5)
Chloride: 103 mEq/L (ref 96–112)
Creat: 0.74 mg/dL (ref 0.50–1.10)

## 2013-10-17 LAB — GLUCOSE, POCT (MANUAL RESULT ENTRY): POC Glucose: 78 mg/dl (ref 70–99)

## 2013-10-17 MED ORDER — ONDANSETRON HCL 4 MG PO TABS: 4.0000 mg | ORAL_TABLET | Freq: Four times a day (QID) | ORAL | Status: DC | PRN

## 2013-10-17 MED ORDER — PREDNISONE 20 MG PO TABS: ORAL_TABLET | ORAL | Status: DC

## 2013-10-17 NOTE — Progress Notes (Signed)
Subjective:    Patient ID: Sarah Morrison, female    DOB: [birth year], 22 y.o.   MRN: [id number]  HPI Sarah Morrison is a 22 YO female with a history of Crohn's disease who presents with bloody, gelatenous stool, right lower quadrant pain, loss of appetite, chills, intermittent headaches, and weakness.  Her last flare of Crohn's disease was in June, 2014 and was seen in the ED, and has been controlled by Remicade infusions every 6 weeks with her last infusion on September 26th, 2014.  She began having a weakness and complete loss of appetite 5 days ago that has caused her to not be able to eat anything since Friday. She states she typically has severe RLQ pain with her Crohn flare-ups but is currently experiencing more mild RLQ pain and more generalized weakness than her typical flare-ups. The patient's bloody gelatinous stool began Sunday and has persisted ever since. She states she has had intermittent nausea for the past 5 days and attempted to eat on Sunday that lead to vomiting. She states she has become progressively more weak since these symptoms began and states walking up a flight of stairs leads to shortness of breath and diffuse body weakness causing her to stop and sit to rest. The patient has also noticed her hands shaking intermittently. She is unaware what causes her Crohn's disease flare-ups and denies any recent change in diet.  She states she has been drinking 3-5 bottles of water/day and has tried taking Bene fiber last night that has lead to another gelatinous bloody stool this morning.  She typically sees Dr. [name] GI specialist to control her Crohn's disease. She denies any reflux, sore throat, nasal congestion, or rhinorrhea.     Review of Systems  Constitutional: Positive for fever, chills, activity change, appetite change and fatigue. Negative for diaphoresis.  HENT: Negative for congestion, ear discharge, ear pain, postnasal drip, rhinorrhea, sinus pressure, sneezing, sore throat  and tinnitus.   Eyes: Positive for visual disturbance.  Respiratory: Positive for shortness of breath.   Cardiovascular: Negative for chest pain and leg swelling.  Gastrointestinal: Positive for nausea, vomiting, abdominal pain, diarrhea and blood in stool. Negative for constipation, abdominal distention and anal bleeding.  Musculoskeletal: Positive for myalgias.  Neurological: Positive for tremors.       Objective:   Physical Exam Sarah Morrison is a pleasant well-developed, well-nourished [place] female with intermittent tremors noted in hands.  Heart: Normal rate and rhythm, no murmurs, rubs, or gallops Lungs: Clear to auscultation bilaterally, no wheezing, rhonchi, or rales Abdomen: soft with no hepatosplenomegaly or masses. Tenderness in both lower quadrants with mild guarding but no rebound.  No referred tenderness.  EENT: External ear and ear canals clear with no erythema noted and TM visible bilaterally.  Nasal mucosa non-erythematous and non-swollen.  Oral pharynx non-erythematous with no tonsillar swelling or exudate.  Neck supple with no cervical lymphadenopathy present bilaterally.  Frontal sinuses non-tender to palpation.    BP 130/88  Pulse 90  Temp(Src) 98.1 F (36.7 C) (Oral)  Resp 18  Ht 5\' 7"  (1.702 m)  Wt 201 lb 3.2 oz (91.264 kg)  BMI 31.51 kg/m2  SpO2 100%  LMP 10/09/2013  Results for orders placed in visit on 10/17/13  POCT CBC      Result Value Range   WBC 7.1  4.6 - 10.2 K/uL   Lymph, poc 2.5  0.6 - 3.4   POC LYMPH PERCENT 35.2  10 - 50 %L   MID (  cbc) 0.4  0 - 0.9   POC MID % 5.7  0 - 12 %M   POC Granulocyte 4.2  2 - 6.9   Granulocyte percent 59.1  37 - 80 %G   RBC 4.71  4.04 - 5.48 M/uL   Hemoglobin 12.5  12.2 - 16.2 g/dL   HCT, POC 16.1  09.6 - 47.9 %   MCV 84.1  80 - 97 fL   MCH, POC 26.5 (*) 27 - 31.2 pg   MCHC 31.6 (*) 31.8 - 35.4 g/dL   RDW, POC 04.5     Platelet Count, POC 225  142 - 424 K/uL   MPV 8.8  0 - 99.8 fL  GLUCOSE, POCT  (MANUAL RESULT ENTRY)      Result Value Range   POC Glucose 78  70 - 99 mg/dl       Assessment & Plan:  Abdominal  pain, other specified site - Plan: POCT CBC, POCT glucose (manual entry), Comprehensive metabolic panel  Nausea with vomiting - Plan: ondansetron (ZOFRAN) 4 MG tablet  Crohn's colitis - Plan: predniSONE (DELTASONE) 20 MG tablet, 9 day taper.    Patient Instructions  Continue to stay hydrated.  Gradually advance your diet as you are able. If your symptoms persist, contact your GI specialist, primary care provider or return here for additional evaluation.

## 2013-10-17 NOTE — Patient Instructions (Addendum)
Continue to stay hydrated.  Gradually advance your diet as you are able. If your symptoms persist, contact your GI specialist, primary care provider or return here for additional evaluation.

## 2013-10-18 NOTE — Progress Notes (Signed)
I have examined this patient along with the student and agree. Apparent acute flare of Crohn's.  Anticipatory guidance.  If treatment ineffective, contact GI specialist, PCP or return here.

## 2013-10-27 ENCOUNTER — Encounter (HOSPITAL_COMMUNITY)
Admission: RE | Admit: 2013-10-27 | Discharge: 2013-10-27 | Disposition: A | Discharge status: Home / Self Care | Payer: Managed Care, Other (non HMO) | Source: Ambulatory Visit | Attending: Gastroenterology | Admitting: Gastroenterology

## 2013-10-27 DIAGNOSIS — K5090 Crohn's disease, unspecified, without complications: Secondary | ICD-10-CM | POA: Insufficient documentation

## 2013-10-27 MED ORDER — ACETAMINOPHEN 325 MG PO TABS
ORAL_TABLET | ORAL | Status: AC
  Administered 2013-10-27: 650 mg
  Filled 2013-10-27: qty 2

## 2013-10-27 MED ORDER — DIPHENHYDRAMINE HCL 50 MG/ML IJ SOLN: 50.0000 mg | INTRAMUSCULAR | Status: DC

## 2013-10-27 MED ORDER — ACETAMINOPHEN 325 MG PO TABS: 650.0000 mg | ORAL_TABLET | ORAL | Status: DC

## 2013-10-27 MED ORDER — SODIUM CHLORIDE 0.9 % IV SOLN
INTRAVENOUS | Status: DC
  Administered 2013-10-27: 09:00:00 via INTRAVENOUS

## 2013-10-27 MED ORDER — INFLIXIMAB 100 MG IV SOLR
5.0000 mg/kg | INTRAVENOUS | Status: DC
  Administered 2013-10-27: 400 mg via INTRAVENOUS
  Filled 2013-10-27: qty 40

## 2013-10-27 MED ORDER — DIPHENHYDRAMINE HCL 50 MG/ML IJ SOLN
INTRAMUSCULAR | Status: AC
  Administered 2013-10-27: 50 mg
  Filled 2013-10-27: qty 1

## 2013-12-26 ENCOUNTER — Encounter (HOSPITAL_COMMUNITY): Payer: Managed Care, Other (non HMO)

## 2013-12-28 ENCOUNTER — Encounter (HOSPITAL_COMMUNITY)
Admission: RE | Admit: 2013-12-28 | Discharge: 2013-12-28 | Disposition: A | Discharge status: Home / Self Care | Payer: Managed Care, Other (non HMO) | Source: Ambulatory Visit | Attending: Gastroenterology | Admitting: Gastroenterology

## 2013-12-28 DIAGNOSIS — K5090 Crohn's disease, unspecified, without complications: Secondary | ICD-10-CM | POA: Insufficient documentation

## 2013-12-28 MED ORDER — DIPHENHYDRAMINE HCL 50 MG/ML IJ SOLN: 50.0000 mg | INTRAMUSCULAR | Status: AC

## 2013-12-28 MED ORDER — SODIUM CHLORIDE 0.9 % IV SOLN
5.0000 mg/kg | INTRAVENOUS | Status: AC
  Administered 2013-12-28: 400 mg via INTRAVENOUS
  Filled 2013-12-28: qty 40

## 2013-12-28 MED ORDER — ACETAMINOPHEN 325 MG PO TABS
ORAL_TABLET | ORAL | Status: AC
  Administered 2013-12-28: 650 mg via ORAL
  Filled 2013-12-28: qty 2

## 2013-12-28 MED ORDER — ACETAMINOPHEN 325 MG PO TABS: 650.0000 mg | ORAL_TABLET | ORAL | Status: AC

## 2013-12-28 MED ORDER — DIPHENHYDRAMINE HCL 50 MG/ML IJ SOLN
INTRAMUSCULAR | Status: AC
  Administered 2013-12-28: 50 mg via INTRAVENOUS
  Filled 2013-12-28: qty 1

## 2013-12-28 MED ORDER — SODIUM CHLORIDE 0.9 % IV SOLN
INTRAVENOUS | Status: AC
  Administered 2013-12-28: 250 mL via INTRAVENOUS

## 2014-02-22 ENCOUNTER — Other Ambulatory Visit (HOSPITAL_COMMUNITY): Payer: Self-pay | Admitting: *Deleted

## 2014-02-23 ENCOUNTER — Encounter (HOSPITAL_COMMUNITY)
Admission: RE | Admit: 2014-02-23 | Discharge: 2014-02-23 | Disposition: A | Discharge status: Home / Self Care | Payer: Managed Care, Other (non HMO) | Source: Ambulatory Visit | Attending: Gastroenterology | Admitting: Gastroenterology

## 2014-02-23 DIAGNOSIS — K5090 Crohn's disease, unspecified, without complications: Secondary | ICD-10-CM | POA: Insufficient documentation

## 2014-02-23 MED ORDER — DIPHENHYDRAMINE HCL 50 MG/ML IJ SOLN
INTRAMUSCULAR | Status: AC
  Administered 2014-02-23: 50 mg via INTRAVENOUS
  Filled 2014-02-23: qty 1

## 2014-02-23 MED ORDER — ACETAMINOPHEN 325 MG PO TABS: 650.0000 mg | ORAL_TABLET | ORAL | Status: DC

## 2014-02-23 MED ORDER — INFLIXIMAB 100 MG IV SOLR
5.0000 mg/kg | INTRAVENOUS | Status: DC
  Administered 2014-02-23: 400 mg via INTRAVENOUS
  Filled 2014-02-23: qty 40

## 2014-02-23 MED ORDER — ACETAMINOPHEN 325 MG PO TABS
ORAL_TABLET | ORAL | Status: AC
  Administered 2014-02-23: 650 mg via ORAL
  Filled 2014-02-23: qty 2

## 2014-02-23 MED ORDER — SODIUM CHLORIDE 0.9 % IV SOLN
INTRAVENOUS | Status: DC
  Administered 2014-02-23: 09:00:00 via INTRAVENOUS

## 2014-02-23 MED ORDER — DIPHENHYDRAMINE HCL 50 MG/ML IJ SOLN: 50.0000 mg | INTRAMUSCULAR | Status: DC

## 2014-02-27 ENCOUNTER — Emergency Department (HOSPITAL_COMMUNITY)
Admission: EM | Admit: 2014-02-27 | Discharge: 2014-02-27 | Disposition: A | Discharge status: Home / Self Care | Payer: Managed Care, Other (non HMO) | Source: Home / Self Care | Attending: Emergency Medicine | Admitting: Emergency Medicine

## 2014-02-27 ENCOUNTER — Encounter (HOSPITAL_COMMUNITY): Payer: Self-pay | Admitting: Emergency Medicine

## 2014-02-27 DIAGNOSIS — R51 Headache: Secondary | ICD-10-CM

## 2014-02-27 DIAGNOSIS — R519 Headache, unspecified: Secondary | ICD-10-CM

## 2014-02-27 MED ORDER — BUTALBITAL-APAP-CAFFEINE 50-325-40 MG PO TABS: 1.0000 | ORAL_TABLET | Freq: Four times a day (QID) | ORAL | Status: DC | PRN

## 2014-02-27 NOTE — ED Notes (Signed)
Pt c/o intermittent HA onset 1 month Will have x3 episodes daily; increases w/bright light and loud noise Taking ibup w/no relief Alert w/no signs of acute distress.

## 2014-02-27 NOTE — ED Provider Notes (Signed)
CSN: [id number]     Arrival date & time 02/27/14  1052 History   First MD Initiated Contact with Patient 02/27/14 1223     Chief Complaint  Patient presents with  . Headache   (Consider location/radiation/quality/duration/timing/severity/associated sxs/prior Treatment) HPI Comments: 23 year old female presents for evaluation of a daily headache for one month. One to 3 times per day, she will have a headache that lasts 30 minutes to one hour. This headache is generally located in the left side of her head and is associated with nausea and photophobia. She rates the pain as 8-10 out of 10 in severity. She will take ibuprofen for this and the rest and the headache will go away within about an hour. She has no previous history of headaches. She denies any headache at this time. She denies vomiting, blurry vision, stiff neck, rash, fever. She has a  history of Crohn's disease. Currently no GI symptoms.   Past Medical History  Diagnosis Date  . Gastritis   . Dermoid cyst of ovary, 6.1cm by CT [id number] 10/11/2012  . Ileitis, probable Crohn's disease 10/11/2012    CT 10/11/2012: Marked inflammatory findings in the terminal ileum and adjacent  cecum, with enlarged pericecal lymph nodes. The appearance favors  Crohn's disease/terminal ileitis, with infectious enterocolitis  less likely given the segmental involvement. No definite  extraluminal gas although there is mild complex ascites in the  pelvis.     Past Surgical History  Procedure Laterality Date  . 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 Esophagogastroduodenoscopy    . Colonoscopy  10/12/2012    Procedure: COLONOSCOPY;  Surgeon: Inda Castle, MD;  Location: [place];  Service: Endoscopy;  Laterality: N/A;  . Laparotomy N/A 06/16/2013    Procedure: EXPLORATORY LAPAROTOMY With Removal of Left Ovarian Dermoid Cyst;  Surgeon: Thurnell Lose, MD;  Location: [facility];  Service: Gynecology;   Laterality: N/A;   Family History  Problem Relation Age of Onset  . Colon cancer Neg Hx    History  Substance Use Topics  . Smoking status: Never Smoker   . Smokeless tobacco: Never Used  . Alcohol Use: No   OB History   Grav Para Term Preterm Abortions TAB SAB Ect Mult Living   0              Review of Systems  Constitutional: Negative for fever, fatigue and unexpected weight change.  Eyes: Positive for photophobia.  Gastrointestinal: Positive for nausea.  Neurological: Positive for headaches. Negative for dizziness, syncope, facial asymmetry, speech difficulty, weakness, light-headedness and numbness.  All other systems reviewed and are negative.    Allergies  Review of patient's allergies indicates no known allergies.  Home Medications   Current Outpatient Rx  Name  Route  Sig  Dispense  Refill  . inFLIXimab (REMICADE) 100 MG injection   Intravenous   Inject into the vein.         . butalbital-acetaminophen-caffeine (FIORICET) 50-325-40 MG per tablet   Oral   Take 1-2 tablets by mouth every 6 (six) hours as needed for headache.   30 tablet   0   . norelgestromin-ethinyl estradiol (ORTHO EVRA) 150-35 MCG/24HR transdermal patch   Transdermal   Place 1 patch onto the skin once a week.         . ondansetron (ZOFRAN) 4 MG tablet   Oral   Take 1-2 tablets (4-8 mg total) by mouth every 6 (six) hours as  needed for nausea.   30 tablet   0   . predniSONE (DELTASONE) 20 MG tablet      Take 3 PO QAM x3days, 2 PO QAM x3days, 1 PO QAM x3days   18 tablet   0    BP 131/84  Pulse 83  Temp(Src) 98.1 F (36.7 C) (Oral)  Resp 16  SpO2 99%  LMP 02/07/2014 Physical Exam  Nursing note and vitals reviewed. Constitutional: She is oriented to person, place, and time. Vital signs are normal. She appears well-developed and well-nourished. No distress.  HENT:  Head: Normocephalic and atraumatic.  Eyes: Conjunctivae and EOM are normal. Pupils are equal, round, and  reactive to light. Right eye exhibits no discharge. Left eye exhibits no discharge.  Neck: Normal range of motion. Neck supple.  Cardiovascular: Normal rate, regular rhythm and normal heart sounds.  Exam reveals no gallop and no friction rub.   No murmur heard. Pulmonary/Chest: Effort normal and breath sounds normal. No respiratory distress. She has no wheezes. She has no rales.  Abdominal: Soft. She exhibits no mass. There is no tenderness. There is no rebound and no guarding.  Musculoskeletal: Normal range of motion.  Lymphadenopathy:    She has no cervical adenopathy.  Neurological: She is alert and oriented to person, place, and time. She has normal strength and normal reflexes. No cranial nerve deficit or sensory deficit. She exhibits normal muscle tone. She displays a negative Romberg sign. Coordination and gait normal. GCS eye subscore is 4. GCS verbal subscore is 5. GCS motor subscore is 6.  Skin: Skin is warm and dry. No rash noted. She is not diaphoretic.  Psychiatric: She has a normal mood and affect. Judgment normal.    ED Course  Procedures (including critical care time) Labs Review Labs Reviewed - No data to display Imaging Review No results found.   MDM   1. Frequent headaches    Physical exam and detailed neurologic exam are normal. We can try Fioricet as needed for the headaches but she will need to followup with neurology for evaluation of new onset possible frequent migraines /chronic daily headache  Meds ordered this encounter  Medications  . butalbital-acetaminophen-caffeine (FIORICET) 50-325-40 MG per tablet    Sig: Take 1-2 tablets by mouth every 6 (six) hours as needed for headache.    Dispense:  30 tablet    Refill:  0    Order Specific Question:  Supervising Provider    Answer:  Ihor Gully D [place], PA-C 02/27/14 [phone]

## 2014-02-27 NOTE — Discharge Instructions (Signed)
Headaches, Frequently Asked Questions MIGRAINE HEADACHES Q: What is migraine? What causes it? How can I treat it? A: Generally, migraine headaches begin as a dull ache. Then they develop into a constant, throbbing, and pulsating pain. You may experience pain at the temples. You may experience pain at the front or back of one or both sides of the head. The pain is usually accompanied by a combination of:  Nausea.  Vomiting.  Sensitivity to light and noise. Some people (about 15%) experience an aura (see below) before an attack. The cause of migraine is believed to be chemical reactions in the brain. Treatment for migraine may include over-the-counter or prescription medications. It may also include self-help techniques. These include relaxation training and biofeedback.  Q: What is an aura? A: About 15% of people with migraine get an "aura". This is a sign of neurological symptoms that occur before a migraine headache. You may see wavy or jagged lines, dots, or flashing lights. You might experience tunnel vision or blind spots in one or both eyes. The aura can include visual or auditory hallucinations (something imagined). It may include disruptions in smell (such as strange odors), taste or touch. Other symptoms include:  Numbness.  A "pins and needles" sensation.  Difficulty in recalling or speaking the correct word. These neurological events may last as long as 60 minutes. These symptoms will fade as the headache begins. Q: What is a trigger? A: Certain physical or environmental factors can lead to or "trigger" a migraine. These include:  Foods.  Hormonal changes.  Weather.  Stress. It is important to remember that triggers are different for everyone. To help prevent migraine attacks, you need to figure out which triggers affect you. Keep a headache diary. This is a good way to track triggers. The diary will help you talk to your healthcare professional about your condition. Q: Does  weather affect migraines? A: Bright sunshine, hot, humid conditions, and drastic changes in barometric pressure may lead to, or "trigger," a migraine attack in some people. But studies have shown that weather does not act as a trigger for everyone with migraines. Q: What is the link between migraine and hormones? A: Hormones start and regulate many of your body's functions. Hormones keep your body in balance within a constantly changing environment. The levels of hormones in your body are unbalanced at times. Examples are during menstruation, pregnancy, or menopause. That can lead to a migraine attack. In fact, about three quarters of all women with migraine report that their attacks are related to the menstrual cycle.  Q: Is there an increased risk of stroke for migraine sufferers? A: The likelihood of a migraine attack causing a stroke is very remote. That is not to say that migraine sufferers cannot have a stroke associated with their migraines. In persons under age 53, the most common associated factor for stroke is migraine headache. But over the course of a person's normal life span, the occurrence of migraine headache may actually be associated with a reduced risk of dying from cerebrovascular disease due to stroke.  Q: What are acute medications for migraine? A: Acute medications are used to treat the pain of the headache after it has started. Examples over-the-counter medications, NSAIDs, ergots, and triptans.  Q: What are the triptans? A: Triptans are the newest class of abortive medications. They are specifically targeted to treat migraine. Triptans are vasoconstrictors. They moderate some chemical reactions in the brain. The triptans work on receptors in your brain. Triptans help  to restore the balance of a neurotransmitter called serotonin. Fluctuations in levels of serotonin are thought to be a main cause of migraine.  °Q: Are over-the-counter medications for migraine effective? °A:  Over-the-counter, or "OTC," medications may be effective in relieving mild to moderate pain and associated symptoms of migraine. But you should see your caregiver before beginning any treatment regimen for migraine.  °Q: What are preventive medications for migraine? °A: Preventive medications for migraine are sometimes referred to as "prophylactic" treatments. They are used to reduce the frequency, severity, and length of migraine attacks. Examples of preventive medications include antiepileptic medications, antidepressants, beta-blockers, calcium channel blockers, and NSAIDs (nonsteroidal anti-inflammatory drugs). °Q: Why are anticonvulsants used to treat migraine? °A: During the past few years, there has been an increased interest in antiepileptic drugs for the prevention of migraine. They are sometimes referred to as "anticonvulsants". Both epilepsy and migraine may be caused by similar reactions in the brain.  °Q: Why are antidepressants used to treat migraine? °A: Antidepressants are typically used to treat people with depression. They may reduce migraine frequency by regulating chemical levels, such as serotonin, in the brain.  °Q: What alternative therapies are used to treat migraine? °A: The term "alternative therapies" is often used to describe treatments considered outside the scope of conventional Western medicine. Examples of alternative therapy include acupuncture, acupressure, and yoga. Another common alternative treatment is herbal therapy. Some herbs are believed to relieve headache pain. Always discuss alternative therapies with your caregiver before proceeding. Some herbal products contain arsenic and other toxins. °TENSION HEADACHES °Q: What is a tension-type headache? What causes it? How can I treat it? °A: Tension-type headaches occur randomly. They are often the result of temporary stress, anxiety, fatigue, or anger. Symptoms include soreness in your temples, a tightening band-like sensation  around your head (a "vice-like" ache). Symptoms can also include a pulling feeling, pressure sensations, and contracting head and neck muscles. The headache begins in your forehead, temples, or the back of your head and neck. Treatment for tension-type headache may include over-the-counter or prescription medications. Treatment may also include self-help techniques such as relaxation training and biofeedback. °CLUSTER HEADACHES °Q: What is a cluster headache? What causes it? How can I treat it? °A: Cluster headache gets its name because the attacks come in groups. The pain arrives with little, if any, warning. It is usually on one side of the head. A tearing or bloodshot eye and a runny nose on the same side of the headache may also accompany the pain. Cluster headaches are believed to be caused by chemical reactions in the brain. They have been described as the most severe and intense of any headache type. Treatment for cluster headache includes prescription medication and oxygen. °SINUS HEADACHES °Q: What is a sinus headache? What causes it? How can I treat it? °A: When a cavity in the bones of the face and skull (a sinus) becomes inflamed, the inflammation will cause localized pain. This condition is usually the result of an allergic reaction, a tumor, or an infection. If your headache is caused by a sinus blockage, such as an infection, you will probably have a fever. An x-ray will confirm a sinus blockage. Your caregiver's treatment might include antibiotics for the infection, as well as antihistamines or decongestants.  °REBOUND HEADACHES °Q: What is a rebound headache? What causes it? How can I treat it? °A: A pattern of taking acute headache medications too often can lead to a condition known as "rebound headache."   A pattern of taking too much headache medication includes taking it more than 2 days per week or in excessive amounts. That means more than the label or a caregiver advises. With rebound  headaches, your medications not only stop relieving pain, they actually begin to cause headaches. Doctors treat rebound headache by tapering the medication that is being overused. Sometimes your caregiver will gradually substitute a different type of treatment or medication. Stopping may be a challenge. Regularly overusing a medication increases the potential for serious side effects. Consult a caregiver if you regularly use headache medications more than 2 days per week or more than the label advises. ADDITIONAL QUESTIONS AND ANSWERS Q: What is biofeedback? A: Biofeedback is a self-help treatment. Biofeedback uses special equipment to monitor your body's involuntary physical responses. Biofeedback monitors:  Breathing.  Pulse.  Heart rate.  Temperature.  Muscle tension.  Brain activity. Biofeedback helps you refine and perfect your relaxation exercises. You learn to control the physical responses that are related to stress. Once the technique has been mastered, you do not need the equipment any more. Q: Are headaches hereditary? A: Four out of five (80%) of people that suffer report a family history of migraine. Scientists are not sure if this is genetic or a family predisposition. Despite the uncertainty, a child has a 50% chance of having migraine if one parent suffers. The child has a 75% chance if both parents suffer.  Q: Can children get headaches? A: By the time they reach high school, most young people have experienced some type of headache. Many safe and effective approaches or medications can prevent a headache from occurring or stop it after it has begun.  Q: What type of doctor should I see to diagnose and treat my headache? A: Start with your primary caregiver. Discuss his or her experience and approach to headaches. Discuss methods of classification, diagnosis, and treatment. Your caregiver may decide to recommend you to a headache specialist, depending upon your symptoms or other  physical conditions. Having diabetes, allergies, etc., may require a more comprehensive and inclusive approach to your headache. The National Headache Foundation will provide, upon request, a list of [facility] physician members in your state. Document Released: 02/13/2004 Document Revised: 02/15/2012 Document Reviewed: 07/23/2008 [facility] Patient Information 2014 [place].  Migraine Headache A migraine headache is an intense, throbbing pain on one or both sides of your head. A migraine can last for 30 minutes to several hours. CAUSES  The exact cause of a migraine headache is not always known. However, a migraine may be caused when nerves in the brain become irritated and release chemicals that cause inflammation. This causes pain. Certain things may also trigger migraines, such as:  Alcohol.  Smoking.  Stress.  Menstruation.  Aged cheeses.  Foods or drinks that contain nitrates, glutamate, aspartame, or tyramine.  Lack of sleep.  Chocolate.  Caffeine.  Hunger.  Physical exertion.  Fatigue.  Medicines used to treat chest pain (nitroglycerine), birth control pills, estrogen, and some blood pressure medicines. SIGNS AND SYMPTOMS  Pain on one or both sides of your head.  Pulsating or throbbing pain.  Severe pain that prevents daily activities.  Pain that is aggravated by any physical activity.  Nausea, vomiting, or both.  Dizziness.  Pain with exposure to bright lights, loud noises, or activity.  General sensitivity to bright lights, loud noises, or smells. Before you get a migraine, you may get warning signs that a migraine is coming (aura). An aura may include:  Seeing flashing lights.  Seeing bright spots, halos, or zig-zag lines.  Having tunnel vision or blurred vision.  Having feelings of numbness or tingling.  Having trouble talking.  Having muscle weakness. DIAGNOSIS  A migraine headache is often diagnosed based on:  Symptoms.  Physical  exam.  A CT scan or MRI of your head. These imaging tests cannot diagnose migraines, but they can help rule out other causes of headaches. TREATMENT Medicines may be given for pain and nausea. Medicines can also be given to help prevent recurrent migraines.  HOME CARE INSTRUCTIONS  Only take over-the-counter or prescription medicines for pain or discomfort as directed by your health care provider. The use of long-term narcotics is not recommended.  Lie down in a dark, quiet room when you have a migraine.  Keep a journal to find out what may trigger your migraine headaches. For example, write down:  What you eat and drink.  How much sleep you get.  Any change to your diet or medicines.  Limit alcohol consumption.  Quit smoking if you smoke.  Get 7 9 hours of sleep, or as recommended by your health care provider.  Limit stress.  Keep lights dim if bright lights bother you and make your migraines worse. SEEK IMMEDIATE MEDICAL CARE IF:   Your migraine becomes severe.  You have a fever.  You have a stiff neck.  You have vision loss.  You have muscular weakness or loss of muscle control.  You start losing your balance or have trouble walking.  You feel faint or pass out.  You have severe symptoms that are different from your first symptoms. MAKE SURE YOU:   Understand these instructions.  Will watch your condition.  Will get help right away if you are not doing well or get worse. Document Released: 11/23/2005 Document Revised: 09/13/2013 Document Reviewed: 07/31/2013 [facility] Patient Information 2014 [place].

## 2014-03-01 NOTE — ED Provider Notes (Signed)
Medical screening examination/treatment/procedure(s) were performed by non-physician practitioner and as supervising physician I was immediately available for consultation/collaboration.  Philipp Deputy, M.D.  Harden Mo, MD 03/01/14 2106

## 2014-04-20 ENCOUNTER — Encounter (HOSPITAL_COMMUNITY): Payer: Managed Care, Other (non HMO)

## 2014-04-26 ENCOUNTER — Other Ambulatory Visit (HOSPITAL_COMMUNITY): Payer: Self-pay

## 2014-04-27 ENCOUNTER — Encounter (HOSPITAL_COMMUNITY)
Admission: RE | Admit: 2014-04-27 | Discharge: 2014-04-27 | Disposition: A | Discharge status: Home / Self Care | Payer: Managed Care, Other (non HMO) | Source: Ambulatory Visit | Attending: Gastroenterology | Admitting: Gastroenterology

## 2014-04-27 DIAGNOSIS — K5090 Crohn's disease, unspecified, without complications: Secondary | ICD-10-CM | POA: Insufficient documentation

## 2014-04-27 LAB — CBC WITH DIFFERENTIAL/PLATELET
Basophils Absolute: 0 10*3/uL (ref 0.0–0.1)
Basophils Relative: 0 % (ref 0–1)
Eosinophils Absolute: 0.2 10*3/uL (ref 0.0–0.7)
Eosinophils Relative: 3 % (ref 0–5)
HEMATOCRIT: 35.4 % — AB (ref 36.0–46.0)
Hemoglobin: 12 g/dL (ref 12.0–15.0)
LYMPHS PCT: 40 % (ref 12–46)
Lymphs Abs: 2.9 10*3/uL (ref 0.7–4.0)
MCH: 28 pg (ref 26.0–34.0)
MCHC: 33.9 g/dL (ref 30.0–36.0)
MCV: 82.7 fL (ref 78.0–100.0)
MONOS PCT: 8 % (ref 3–12)
Monocytes Absolute: 0.6 10*3/uL (ref 0.1–1.0)
NEUTROS ABS: 3.6 10*3/uL (ref 1.7–7.7)
Neutrophils Relative %: 49 % (ref 43–77)
Platelets: 231 10*3/uL (ref 150–400)
RBC: 4.28 MIL/uL (ref 3.87–5.11)
RDW: 13 % (ref 11.5–15.5)
WBC: 7.2 10*3/uL (ref 4.0–10.5)

## 2014-04-27 LAB — COMPREHENSIVE METABOLIC PANEL
ALT: 23 U/L (ref 0–35)
AST: 26 U/L (ref 0–37)
Albumin: 3.1 g/dL — ABNORMAL LOW (ref 3.5–5.2)
Alkaline Phosphatase: 84 U/L (ref 39–117)
BUN: 10 mg/dL (ref 6–23)
CHLORIDE: 104 meq/L (ref 96–112)
CO2: 23 meq/L (ref 19–32)
CREATININE: 0.62 mg/dL (ref 0.50–1.10)
Calcium: 8.3 mg/dL — ABNORMAL LOW (ref 8.4–10.5)
GFR calc Af Amer: >90 mL/min (ref >90)
Glucose, Bld: 91 mg/dL (ref 70–99)
Potassium: 4 mEq/L (ref 3.7–5.3)
Sodium: 140 mEq/L (ref 137–147)
Total Protein: 7.2 g/dL (ref 6.0–8.3)

## 2014-04-27 MED ORDER — DIPHENHYDRAMINE HCL 50 MG/ML IJ SOLN
INTRAMUSCULAR | Status: AC
  Filled 2014-04-27: qty 1

## 2014-04-27 MED ORDER — SODIUM CHLORIDE 0.9 % IV SOLN
5.0000 mg/kg | INTRAVENOUS | Status: DC
  Administered 2014-04-27: 400 mg via INTRAVENOUS
  Filled 2014-04-27: qty 40

## 2014-04-27 MED ORDER — ACETAMINOPHEN 325 MG PO TABS
ORAL_TABLET | ORAL | Status: AC
  Filled 2014-04-27: qty 2

## 2014-04-27 MED ORDER — DIPHENHYDRAMINE HCL 50 MG/ML IJ SOLN
50.0000 mg | Freq: Once | INTRAMUSCULAR | Status: AC
  Administered 2014-04-27: 50 mg via INTRAVENOUS

## 2014-04-27 MED ORDER — ACETAMINOPHEN 325 MG PO TABS
650.0000 mg | ORAL_TABLET | Freq: Once | ORAL | Status: AC
  Administered 2014-04-27: 650 mg via ORAL

## 2014-04-27 MED ORDER — SODIUM CHLORIDE 0.9 % IV SOLN
INTRAVENOUS | Status: DC
  Administered 2014-04-27: 09:00:00 via INTRAVENOUS

## 2014-05-08 ENCOUNTER — Other Ambulatory Visit (HOSPITAL_COMMUNITY)
Admission: RE | Admit: 2014-05-08 | Discharge: 2014-05-08 | Disposition: A | Discharge status: Home / Self Care | Payer: Managed Care, Other (non HMO) | Source: Ambulatory Visit | Attending: Obstetrics and Gynecology | Admitting: Obstetrics and Gynecology

## 2014-05-08 ENCOUNTER — Other Ambulatory Visit: Payer: Self-pay | Admitting: Obstetrics and Gynecology

## 2014-05-08 DIAGNOSIS — Z01419 Encounter for gynecological examination (general) (routine) without abnormal findings: Secondary | ICD-10-CM | POA: Insufficient documentation

## 2014-05-28 ENCOUNTER — Other Ambulatory Visit: Payer: Self-pay | Admitting: Gastroenterology

## 2014-05-28 DIAGNOSIS — R1031 Right lower quadrant pain: Secondary | ICD-10-CM

## 2014-05-28 DIAGNOSIS — K50919 Crohn's disease, unspecified, with unspecified complications: Secondary | ICD-10-CM

## 2014-05-29 ENCOUNTER — Ambulatory Visit
Admission: RE | Admit: 2014-05-29 | Discharge: 2014-05-29 | Disposition: A | Discharge status: Home / Self Care | Payer: Managed Care, Other (non HMO) | Source: Ambulatory Visit | Attending: Gastroenterology | Admitting: Gastroenterology

## 2014-05-29 DIAGNOSIS — K50919 Crohn's disease, unspecified, with unspecified complications: Secondary | ICD-10-CM

## 2014-05-29 DIAGNOSIS — R1031 Right lower quadrant pain: Secondary | ICD-10-CM

## 2014-05-29 MED ORDER — IOHEXOL 300 MG/ML  SOLN
100.0000 mL | Freq: Once | INTRAMUSCULAR | Status: AC | PRN
  Administered 2014-05-29: 100 mL via INTRAVENOUS

## 2014-06-22 ENCOUNTER — Inpatient Hospital Stay (HOSPITAL_COMMUNITY): Admission: RE | Admit: 2014-06-22 | Payer: Managed Care, Other (non HMO) | Source: Ambulatory Visit

## 2014-06-28 ENCOUNTER — Emergency Department (HOSPITAL_COMMUNITY): Payer: Managed Care, Other (non HMO)

## 2014-06-28 ENCOUNTER — Ambulatory Visit (INDEPENDENT_AMBULATORY_CARE_PROVIDER_SITE_OTHER): Payer: Managed Care, Other (non HMO) | Admitting: Family Medicine

## 2014-06-28 ENCOUNTER — Encounter (HOSPITAL_COMMUNITY): Payer: Self-pay | Admitting: Emergency Medicine

## 2014-06-28 ENCOUNTER — Emergency Department (HOSPITAL_COMMUNITY)
Admission: EM | Admit: 2014-06-28 | Discharge: 2014-06-28 | Disposition: A | Discharge status: Home / Self Care | Payer: Managed Care, Other (non HMO) | Attending: Emergency Medicine | Admitting: Emergency Medicine

## 2014-06-28 VITALS — BP 130/90 | HR 93 | Temp 98.3°F | Resp 16 | Ht 68.0 in | Wt 203.0 lb

## 2014-06-28 DIAGNOSIS — Z3202 Encounter for pregnancy test, result negative: Secondary | ICD-10-CM | POA: Insufficient documentation

## 2014-06-28 DIAGNOSIS — R1031 Right lower quadrant pain: Secondary | ICD-10-CM

## 2014-06-28 DIAGNOSIS — K5090 Crohn's disease, unspecified, without complications: Secondary | ICD-10-CM | POA: Insufficient documentation

## 2014-06-28 DIAGNOSIS — K5900 Constipation, unspecified: Secondary | ICD-10-CM | POA: Insufficient documentation

## 2014-06-28 DIAGNOSIS — Z79899 Other long term (current) drug therapy: Secondary | ICD-10-CM | POA: Insufficient documentation

## 2014-06-28 DIAGNOSIS — K50919 Crohn's disease, unspecified, with unspecified complications: Secondary | ICD-10-CM

## 2014-06-28 DIAGNOSIS — Z8742 Personal history of other diseases of the female genital tract: Secondary | ICD-10-CM | POA: Insufficient documentation

## 2014-06-28 LAB — COMPREHENSIVE METABOLIC PANEL
ALBUMIN: 3.9 g/dL (ref 3.5–5.2)
ALT: 33 U/L (ref 0–35)
ANION GAP: 13 (ref 5–15)
AST: 27 U/L (ref 0–37)
Alkaline Phosphatase: 92 U/L (ref 39–117)
BILIRUBIN TOTAL: 0.5 mg/dL (ref 0.3–1.2)
BUN: 4 mg/dL — ABNORMAL LOW (ref 6–23)
CHLORIDE: 103 meq/L (ref 96–112)
CO2: 24 meq/L (ref 19–32)
CREATININE: 0.62 mg/dL (ref 0.50–1.10)
Calcium: 9.2 mg/dL (ref 8.4–10.5)
GFR calc Af Amer: >90 mL/min (ref >90)
GFR calc non Af Amer: >90 mL/min (ref >90)
Glucose, Bld: 115 mg/dL — ABNORMAL HIGH (ref 70–99)
POTASSIUM: 4.1 meq/L (ref 3.7–5.3)
Sodium: 140 mEq/L (ref 137–147)
Total Protein: 8.2 g/dL (ref 6.0–8.3)

## 2014-06-28 LAB — URINALYSIS, ROUTINE W REFLEX MICROSCOPIC
BILIRUBIN URINE: NEGATIVE
Glucose, UA: NEGATIVE mg/dL
HGB URINE DIPSTICK: NEGATIVE
KETONES UR: NEGATIVE mg/dL
Leukocytes, UA: NEGATIVE
Nitrite: NEGATIVE
Protein, ur: NEGATIVE mg/dL
Specific Gravity, Urine: 1.015 (ref 1.005–1.030)
UROBILINOGEN UA: 1 mg/dL (ref 0.0–1.0)
pH: 7 (ref 5.0–8.0)

## 2014-06-28 LAB — CBC WITH DIFFERENTIAL/PLATELET
BASOS PCT: 0 % (ref 0–1)
Basophils Absolute: 0 10*3/uL (ref 0.0–0.1)
Eosinophils Absolute: 0.2 10*3/uL (ref 0.0–0.7)
Eosinophils Relative: 2 % (ref 0–5)
HEMATOCRIT: 37.6 % (ref 36.0–46.0)
HEMOGLOBIN: 13 g/dL (ref 12.0–15.0)
Lymphocytes Relative: 27 % (ref 12–46)
Lymphs Abs: 2.8 10*3/uL (ref 0.7–4.0)
MCH: 27.8 pg (ref 26.0–34.0)
MCHC: 34.6 g/dL (ref 30.0–36.0)
MCV: 80.5 fL (ref 78.0–100.0)
MONO ABS: 0.8 10*3/uL (ref 0.1–1.0)
MONOS PCT: 7 % (ref 3–12)
Neutro Abs: 6.6 10*3/uL (ref 1.7–7.7)
Neutrophils Relative %: 64 % (ref 43–77)
Platelets: 304 10*3/uL (ref 150–400)
RBC: 4.67 MIL/uL (ref 3.87–5.11)
RDW: 12.5 % (ref 11.5–15.5)
WBC: 10.4 10*3/uL (ref 4.0–10.5)

## 2014-06-28 LAB — POCT CBC
Granulocyte percent: 67.5 %G (ref 37–80)
HCT, POC: 40.9 % (ref 37.7–47.9)
Hemoglobin: 13.1 g/dL (ref 12.2–16.2)
Lymph, poc: 2.4 (ref 0.6–3.4)
MCH, POC: 27.2 pg (ref 27–31.2)
MCHC: 32.1 g/dL (ref 31.8–35.4)
MCV: 84.6 fL (ref 80–97)
MID (cbc): 0.7 (ref 0–0.9)
MPV: 7.1 fL (ref 0–99.8)
POC Granulocyte: 6.5 (ref 2–6.9)
POC LYMPH PERCENT: 25.2 %L (ref 10–50)
POC MID %: 7.3 %M (ref 0–12)
Platelet Count, POC: 301 10*3/uL (ref 142–424)
RBC: 4.83 M/uL (ref 4.04–5.48)
RDW, POC: 12.8 %
WBC: 9.7 10*3/uL (ref 4.6–10.2)

## 2014-06-28 LAB — LIPASE, BLOOD: LIPASE: 22 U/L (ref 11–59)

## 2014-06-28 LAB — POC URINE PREG, ED: Preg Test, Ur: NEGATIVE

## 2014-06-28 LAB — POCT SEDIMENTATION RATE: POCT SED RATE: 46 mm/hr — AB (ref 0–22)

## 2014-06-28 MED ORDER — ONDANSETRON 4 MG PO TBDP
8.0000 mg | ORAL_TABLET | Freq: Once | ORAL | Status: AC
  Administered 2014-06-28: 8 mg via ORAL
  Filled 2014-06-28: qty 2

## 2014-06-28 MED ORDER — FENTANYL CITRATE 0.05 MG/ML IJ SOLN
50.0000 ug | Freq: Once | INTRAMUSCULAR | Status: AC
  Administered 2014-06-28: 50 ug via INTRAVENOUS
  Filled 2014-06-28: qty 2

## 2014-06-28 MED ORDER — IOHEXOL 300 MG/ML  SOLN
100.0000 mL | Freq: Once | INTRAMUSCULAR | Status: AC | PRN
  Administered 2014-06-28: 100 mL via INTRAVENOUS

## 2014-06-28 MED ORDER — ONDANSETRON 4 MG PO TBDP: 4.0000 mg | ORAL_TABLET | Freq: Three times a day (TID) | ORAL | Status: DC | PRN

## 2014-06-28 MED ORDER — SODIUM CHLORIDE 0.9 % IV BOLUS (SEPSIS)
1000.0000 mL | Freq: Once | INTRAVENOUS | Status: AC
  Administered 2014-06-28: 1000 mL via INTRAVENOUS

## 2014-06-28 MED ORDER — ONDANSETRON HCL 4 MG/2ML IJ SOLN
4.0000 mg | Freq: Once | INTRAMUSCULAR | Status: AC
  Administered 2014-06-28: 4 mg via INTRAVENOUS
  Filled 2014-06-28: qty 2

## 2014-06-28 MED ORDER — HYDROCODONE-ACETAMINOPHEN 5-325 MG PO TABS: 1.0000 | ORAL_TABLET | ORAL | Status: DC | PRN

## 2014-06-28 MED ORDER — PREDNISONE 20 MG PO TABS: 20.0000 mg | ORAL_TABLET | Freq: Every day | ORAL | Status: DC

## 2014-06-28 MED ORDER — PREDNISONE 20 MG PO TABS
60.0000 mg | ORAL_TABLET | Freq: Once | ORAL | Status: AC
  Administered 2014-06-28: 60 mg via ORAL
  Filled 2014-06-28: qty 3

## 2014-06-28 MED ORDER — HYDROMORPHONE HCL PF 1 MG/ML IJ SOLN
1.0000 mg | Freq: Once | INTRAMUSCULAR | Status: AC
  Administered 2014-06-28: 1 mg via INTRAVENOUS
  Filled 2014-06-28: qty 1

## 2014-06-28 NOTE — ED Notes (Signed)
Patient back from CT.

## 2014-06-28 NOTE — Progress Notes (Signed)
This is a 23 year old woman with a one-year history of Crohn's disease is been on medications over the last year. Her last dose of Remicade was in May and she's been started on DL the for the last 3 weeks.  The last several days she started developing crampy right lower quadrant pain without diarrhea or hematochezia. She has had significant nausea along with this but no fevers.  She's being treated by Dr. Michail Sermon [phone])  Objective: Patient is obviously in moderate distress intermittently during the interview, holding her right lower quadrant.  HEENT: Unremarkable Chest: Clear Heart: Regular no murmur Abdomen: Soft without masses, very tender in the right lower quadrant without rebound or guarding. There is no hepatosplenomegaly Skin: Without rash or lesion of suspicion Extremities: No edema, full range of motion  Results for orders placed in visit on 06/28/14  POCT CBC      Result Value Ref Range   WBC 9.7  4.6 - 10.2 K/uL   Lymph, poc 2.4  0.6 - 3.4   POC LYMPH PERCENT 25.2  10 - 50 %L   MID (cbc) 0.7  0 - 0.9   POC MID % 7.3  0 - 12 %M   POC Granulocyte 6.5  2 - 6.9   Granulocyte percent 67.5  37 - 80 %G   RBC 4.83  4.04 - 5.48 M/uL   Hemoglobin 13.1  12.2 - 16.2 g/dL   HCT, POC 40.9  37.7 - 47.9 %   MCV 84.6  80 - 97 fL   MCH, POC 27.2  27 - 31.2 pg   MCHC 32.1  31.8 - 35.4 g/dL   RDW, POC 12.8     Platelet Count, POC 301  142 - 424 K/uL   MPV 7.1  0 - 99.8 fL   Assessment: 23 year old woman with known Crohn disease who is having accelerating pain in the right lower quadrant which is most consistent with a Crohn's flare but could also represent appendicitis.  I spoke with the etiology I office who said that Dr. Michail Sermon is currently in the hospital. I was unable to speak to Dr. Penelope Coop.  Plan: Refer to emergency room or she'll be seen by her gastroenterologist and further evaluation/treatment will be decided.  Signed, Robyn Haber date

## 2014-06-28 NOTE — ED Notes (Signed)
Pt with hx Crohn's disease. Pt reports pain to R side of abdomen with nv x 1 week. Denies urinary sx.

## 2014-06-28 NOTE — Discharge Instructions (Signed)
Your laboratory testing and CAT scan did not show any signs for an emergent cause of your pain. There were signs of mild inflammation related to your Crohn's. Please followup with your specialist for continued evaluation and treatment.     Crohn Disease Crohn disease is a long-term (chronic) soreness and redness (inflammation) of the intestines (bowel). It can affect any portion of the digestive tract, from the mouth to the anus. It can also cause problems outside the digestive tract. Crohn disease is closely related to a disease called ulcerative colitis (together, these two diseases are called inflammatory bowel disease).  CAUSES  The cause of Crohn disease is not known. One Link Snuffer is that, in an easily affected person, the immune system is triggered to attack the body's own digestive tissue. Crohn disease runs in families. It seems to be more common in certain geographic areas and amongst certain races. There are no clear-cut dietary causes.  SYMPTOMS  Crohn disease can cause many different symptoms since it can affect many different parts of the body. Symptoms include:  Fatigue.  Weight loss.  Chronic diarrhea, sometime bloody.  Abdominal pain and cramps.  Fever.  Ulcers or canker sores in the mouth or rectum.  Anemia (low red blood cells).  Arthritis, skin problems, and eye problems may occur. Complications of Crohn disease can include:  Series of holes (perforation) of the bowel.  Portions of the intestines sticking to each other (adhesions).  Obstruction of the bowel.  Fistula formation, typically in the rectal area but also in other areas. A fistula is an opening between the bowels and the outside, or between the bowels and another organ.  A painful crack in the mucous membrane of the anus (rectal fissure). DIAGNOSIS  Your caregiver may suspect Crohn disease based on your symptoms and an exam. Blood tests may confirm that there is a problem. You may be asked to submit a  stool specimen for examination. X-rays and CT scans may be necessary. Ultimately, the diagnosis is usually made after a procedure that uses a flexible tube that is inserted via your mouth or your anus. This is done under sedation and is called either an upper endoscopy or colonoscopy. With these tests, the specialist can take tiny tissue samples and remove them from the inside of the bowel (biopsy). Examination of this biopsy tissue under a microscope can reveal Crohn disease as the cause of your symptoms. Due to the many different forms that Crohn disease can take, symptoms may be present for several years before a diagnosis is made. TREATMENT  Medications are often used to decrease inflammation and control the immune system. These include medicines related to aspirin, steroid medications, and newer and stronger medications to slow down the immune system. Some medications may be used as suppositories or enemas. A number of other medications are used or have been studied. Your caregiver will make specific recommendations. HOME CARE INSTRUCTIONS   Symptoms such as diarrhea can be controlled with medications. Avoid foods that have a laxative effect such as fresh fruit, vegetables, and dairy products. During flare-ups, you can rest your bowel by refraining from solid foods. Drink clear liquids frequently during the day. (Electrolyte or rehydrating fluids are best. Your caregiver can help you with suggestions.) Drink often to prevent loss of body fluids (dehydration). When diarrhea has cleared, eat small meals and more frequently. Avoid food additives and stimulants such as caffeine (coffee, tea, or chocolate). Enzyme supplements may help if you develop intolerance to a sugar in  dairy products (lactose). Ask your caregiver or dietitian about specific dietary instructions.  Try to maintain a positive attitude. Learn relaxation techniques such as self-hypnosis, mental imaging, and muscle relaxation.  If  possible, avoid stresses which can aggravate your condition.  Exercise regularly.  Follow your diet.  Always get plenty of rest. SEEK MEDICAL CARE IF:   Your symptoms fail to improve after a week or two of new treatment.  You experience continued weight loss.  You have ongoing cramps or loose bowels.  You develop a new skin rash, skin sores, or eye problems. SEEK IMMEDIATE MEDICAL CARE IF:   You have worsening of your symptoms or develop new symptoms.  You have a fever.  You develop bloody diarrhea.  You develop severe abdominal pain. MAKE SURE YOU:   Understand these instructions.  Will watch your condition.  Will get help right away if you are not doing well or get worse. Document Released: 09/02/2005 Document Revised: 04/09/2014 Document Reviewed: 08/01/2007 [facility] Patient Information 2015 [place], [place]. This information is not intended to replace advice given to you by your health care provider. Make sure you discuss any questions you have with your health care provider.

## 2014-06-28 NOTE — ED Notes (Signed)
Patient done with 1 cup of contrast.  CT aware.

## 2014-06-28 NOTE — ED Provider Notes (Signed)
CSN: [id number]     Arrival date & time 06/28/14  1643 History   First MD Initiated Contact with Patient 06/28/14 1733     Chief Complaint  Patient presents with  . Abdominal Pain     (Consider location/radiation/quality/duration/timing/severity/associated sxs/prior Treatment) HPI Comments: Sarah Morrison is a 23 yo AA female, with a hx of Crohn's disease, s/p cholecystectomy, who presents with a cramping RLQ pain, radiating into the RUQ. The pain started about 6 days ago over the weekend and has been progressing ever since. She has been having associated nausea and vomiting in the past 24 hours. She normally has 3-4 loose bowel movements per day. In the past couple of days, she has been having constipation, with her last BM this morning. She is currently receiving Remicade injections every 2 months. She was started on Lialda 3-4 weeks ago, after a CT was performed. Admits to hot flushes and chills. She denies hematochezia, hemoptysis, or any urinary symptoms. Has tried Aleve and Tylenol, as well as heating pads; however, the pain relief is only temporary. States that beef is usually a trigger for her Chron's symptoms but has not had any recent dietary changes.   Patient is a 23 y.o. female presenting with abdominal pain. The history is provided by the patient. No language interpreter was used.  Abdominal Pain Pain location:  RLQ Pain quality: cramping   Pain radiates to:  RUQ Onset quality:  Gradual Duration:  6 days Timing:  Constant Progression:  Worsening Chronicity:  Recurrent Relieved by:  Nothing Ineffective treatments:  Acetaminophen Associated symptoms: chills, constipation, nausea and vomiting   Associated symptoms: no diarrhea and no shortness of breath     Past Medical History  Diagnosis Date  . Gastritis   . Dermoid cyst of ovary, 6.1cm by CT [id number] 10/11/2012  . Ileitis, probable Crohn's disease 10/11/2012    CT 10/11/2012: Marked inflammatory findings in the terminal  ileum and adjacent  cecum, with enlarged pericecal lymph nodes. The appearance favors  Crohn's disease/terminal ileitis, with infectious enterocolitis  less likely given the segmental involvement. No definite  extraluminal gas although there is mild complex ascites in the  pelvis.     Past Surgical History  Procedure Laterality Date  . 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 Esophagogastroduodenoscopy    . Colonoscopy  10/12/2012    Procedure: COLONOSCOPY;  Surgeon: Inda Castle, MD;  Location: [facility];  Service: Endoscopy;  Laterality: N/A;  . Laparotomy N/A 06/16/2013    Procedure: EXPLORATORY LAPAROTOMY With Removal of Left Ovarian Dermoid Cyst;  Surgeon: Thurnell Lose, MD;  Location: [facility];  Service: Gynecology;  Laterality: N/A;   Family History  Problem Relation Age of Onset  . Adopted: Yes  . Colon cancer Neg Hx    History  Substance Use Topics  . Smoking status: Never Smoker   . Smokeless tobacco: Never Used  . Alcohol Use: No   OB History   Grav Para Term Preterm Abortions TAB SAB Ect Mult Living   0              Review of Systems  Constitutional: Positive for chills.  Respiratory: Negative for shortness of breath.   Gastrointestinal: Positive for nausea, vomiting, abdominal pain and constipation. Negative for diarrhea and blood in stool.  All other systems reviewed and are negative.     Allergies  Review of patient's allergies indicates no known allergies.  Home Medications  Prior to Admission medications   Medication Sig Start Date End Date Taking? Authorizing Provider  butalbital-acetaminophen-caffeine (FIORICET) 50-325-40 MG per tablet Take 1-2 tablets by mouth every 6 (six) hours as needed for headache. 02/27/14 02/27/15  Liam Graham, PA-C  inFLIXimab (REMICADE) 100 MG injection Inject into the vein.    Historical Provider, MD  mesalamine (LIALDA) 1.2 G EC tablet Take 4.8 g by mouth daily with  breakfast.    Historical Provider, MD  norelgestromin-ethinyl estradiol (ORTHO EVRA) 150-35 MCG/24HR transdermal patch Place 1 patch onto the skin once a week.    Historical Provider, MD  ondansetron (ZOFRAN) 4 MG tablet Take 1-2 tablets (4-8 mg total) by mouth every 6 (six) hours as needed for nausea. 10/17/13   Chelle S Jeffery, PA-C   BP 128/83  Pulse 110  Temp(Src) 98.6 F (37 C) (Oral)  Resp 20  Ht 5\' 7"  (1.702 m)  Wt 203 lb (92.08 kg)  BMI 31.79 kg/m2  SpO2 97%  LMP 06/16/2014 Physical Exam  Constitutional: She is oriented to person, place, and time. She appears well-developed and well-nourished. No distress.  HENT:  Head: Normocephalic and atraumatic.  Eyes: Conjunctivae are normal. No scleral icterus.  Neck: Normal range of motion.  Cardiovascular: Normal rate, regular rhythm and normal heart sounds.  Exam reveals no gallop and no friction rub.   No murmur heard. Pulmonary/Chest: Effort normal and breath sounds normal. No respiratory distress.  Abdominal: Soft. Bowel sounds are normal. She exhibits no distension and no mass. There is tenderness in the right lower quadrant. There is no rebound, no guarding, no tenderness at McBurney's point and negative Murphy's sign.  Neurological: She is alert and oriented to person, place, and time.  Skin: Skin is warm and dry. She is not diaphoretic.    ED Course  Procedures (including critical care time) Labs Review Labs Reviewed  COMPREHENSIVE METABOLIC PANEL - Abnormal; Notable for the following:    Glucose, Bld 115 (*)    BUN 4 (*)    All other components within normal limits  CBC WITH DIFFERENTIAL  LIPASE, BLOOD  URINALYSIS, ROUTINE W REFLEX MICROSCOPIC  POC URINE PREG, ED    Imaging Review No results found.   EKG Interpretation None      MDM   Final diagnoses:  None    8:48 PM Filed Vitals:   06/28/14 1915 06/28/14 1945 06/28/14 2000 06/28/14 2037  BP: 122/76 126/83 126/80 122/83  Pulse: 95 74 82 88  Temp:       TempSrc:      Resp: 9 0 15 15  Height:      Weight:      SpO2: 97% 100% 100% 100%   Patient here with c/o abdominal pain in the R lower quadrant. Likely today or a flareup of her Crohn's I would like to rule out acute appendicitis. Fever, and elevated white count. Pain is currently under control without repeat vomiting. I have given report to Colon, PA-C 06/29/14 1050

## 2014-06-28 NOTE — ED Notes (Signed)
Pt sent here from MD office with right lower quad pain r/o appy versus Crohn's Dz.  Please page Dr. Hubbard Hartshorn to see with GI

## 2014-06-28 NOTE — ED Notes (Signed)
Discharge instructions reviewed with patient  Voiced understanding.

## 2014-06-28 NOTE — ED Notes (Signed)
Sent from [facility]

## 2014-06-28 NOTE — ED Provider Notes (Signed)
Sarah Morrison 8:00 PM patient discussed in sign out. Patient with history of Crohn'Morrison disease presenting with right-sided abdominal pain feeling similar to previous Crohn flareups. Patient is due for her Remicade injection next week which is slightly later than her regular scheduled doses. She is pending a CAT scan.   CT scan shows some acute inflammation consistent with Crohn'Morrison. No complicating features. No perforation or abscess. No signs of appendicitis or other concerning acute abdomen cause for symptoms. Patient continues to be feeling improved after medications. At this time she may be discharged home to followup with her specialist. We'll give dose prednisone here today.    Martie Lee, PA-C 06/28/14 2104

## 2014-06-29 NOTE — ED Provider Notes (Signed)
Medical screening examination/treatment/procedure(s) were performed by non-physician practitioner and as supervising physician I was immediately available for consultation/collaboration.   EKG Interpretation None        Ephraim Hamburger, MD 06/29/14 [id number]

## 2014-07-04 ENCOUNTER — Encounter (HOSPITAL_COMMUNITY)
Admission: RE | Admit: 2014-07-04 | Discharge: 2014-07-04 | Disposition: A | Discharge status: Home / Self Care | Payer: Managed Care, Other (non HMO) | Source: Ambulatory Visit | Attending: Gastroenterology | Admitting: Gastroenterology

## 2014-07-04 DIAGNOSIS — K5090 Crohn's disease, unspecified, without complications: Secondary | ICD-10-CM | POA: Insufficient documentation

## 2014-07-04 MED ORDER — ACETAMINOPHEN 325 MG PO TABS
ORAL_TABLET | ORAL | Status: AC
  Administered 2014-07-04: 650 mg
  Filled 2014-07-04: qty 2

## 2014-07-04 MED ORDER — ACETAMINOPHEN 325 MG PO TABS: 650.0000 mg | ORAL_TABLET | ORAL | Status: DC

## 2014-07-04 MED ORDER — SODIUM CHLORIDE 0.9 % IV SOLN
INTRAVENOUS | Status: DC
  Administered 2014-07-04: 08:00:00 via INTRAVENOUS

## 2014-07-04 MED ORDER — DIPHENHYDRAMINE HCL 50 MG/ML IJ SOLN
INTRAMUSCULAR | Status: AC
  Administered 2014-07-04: 50 mg
  Filled 2014-07-04: qty 1

## 2014-07-04 MED ORDER — DIPHENHYDRAMINE HCL 50 MG/ML IJ SOLN: 50.0000 mg | INTRAMUSCULAR | Status: DC

## 2014-07-04 MED ORDER — SODIUM CHLORIDE 0.9 % IV SOLN
5.0000 mg/kg | INTRAVENOUS | Status: DC
  Administered 2014-07-04: 500 mg via INTRAVENOUS
  Filled 2014-07-04: qty 50

## 2014-07-06 NOTE — ED Provider Notes (Signed)
Medical screening examination/treatment/procedure(s) were performed by non-physician practitioner and as supervising physician I was immediately available for consultation/collaboration.   EKG Interpretation None        Arihanna Estabrook, MD 07/06/14 0023 

## 2014-08-30 ENCOUNTER — Other Ambulatory Visit (HOSPITAL_COMMUNITY): Payer: Self-pay | Admitting: *Deleted

## 2014-08-31 ENCOUNTER — Encounter (HOSPITAL_COMMUNITY)
Admission: RE | Admit: 2014-08-31 | Discharge: 2014-08-31 | Disposition: A | Discharge status: Home / Self Care | Payer: Managed Care, Other (non HMO) | Source: Ambulatory Visit | Attending: Gastroenterology | Admitting: Gastroenterology

## 2014-08-31 DIAGNOSIS — K5090 Crohn's disease, unspecified, without complications: Secondary | ICD-10-CM | POA: Insufficient documentation

## 2014-08-31 MED ORDER — ACETAMINOPHEN 325 MG PO TABS
ORAL_TABLET | ORAL | Status: AC
  Filled 2014-08-31: qty 2

## 2014-08-31 MED ORDER — SODIUM CHLORIDE 0.9 % IV SOLN
INTRAVENOUS | Status: DC
  Administered 2014-08-31: 250 mL via INTRAVENOUS

## 2014-08-31 MED ORDER — ACETAMINOPHEN 325 MG PO TABS
650.0000 mg | ORAL_TABLET | ORAL | Status: AC
  Administered 2014-08-31: 650 mg via ORAL

## 2014-08-31 MED ORDER — DIPHENHYDRAMINE HCL 50 MG/ML IJ SOLN
INTRAMUSCULAR | Status: AC
  Administered 2014-08-31: 50 mg via INTRAVENOUS
  Filled 2014-08-31: qty 1

## 2014-08-31 MED ORDER — DIPHENHYDRAMINE HCL 50 MG/ML IJ SOLN
50.0000 mg | INTRAMUSCULAR | Status: AC
  Administered 2014-08-31: 50 mg via INTRAVENOUS

## 2014-08-31 MED ORDER — SODIUM CHLORIDE 0.9 % IV SOLN
5.0000 mg/kg | INTRAVENOUS | Status: AC
  Administered 2014-08-31: 500 mg via INTRAVENOUS
  Filled 2014-08-31: qty 50

## 2014-09-28 ENCOUNTER — Ambulatory Visit (INDEPENDENT_AMBULATORY_CARE_PROVIDER_SITE_OTHER): Payer: Managed Care, Other (non HMO) | Admitting: Family Medicine

## 2014-09-28 ENCOUNTER — Ambulatory Visit (INDEPENDENT_AMBULATORY_CARE_PROVIDER_SITE_OTHER): Payer: Managed Care, Other (non HMO)

## 2014-09-28 VITALS — BP 108/72 | HR 98 | Temp 98.2°F | Resp 18 | Ht 68.0 in | Wt 201.0 lb

## 2014-09-28 DIAGNOSIS — R071 Chest pain on breathing: Secondary | ICD-10-CM

## 2014-09-28 DIAGNOSIS — R079 Chest pain, unspecified: Secondary | ICD-10-CM

## 2014-09-28 DIAGNOSIS — K50119 Crohn's disease of large intestine with unspecified complications: Secondary | ICD-10-CM

## 2014-09-28 DIAGNOSIS — R0789 Other chest pain: Secondary | ICD-10-CM

## 2014-09-28 LAB — POCT CBC
Granulocyte percent: 70.2 % (ref 37–80)
HCT, POC: 39.5 % (ref 37.7–47.9)
Hemoglobin: 12.9 g/dL (ref 12.2–16.2)
Lymph, poc: 3.2 (ref 0.6–3.4)
MCH, POC: 27.6 pg (ref 27–31.2)
MCHC: 32.7 g/dL (ref 31.8–35.4)
MCV: 84.2 fL (ref 80–97)
MID (cbc): 0.7 (ref 0–0.9)
MPV: 7.6 fL (ref 0–99.8)
POC Granulocyte: 9.1 — AB (ref 2–6.9)
POC LYMPH PERCENT: 24.6 %L (ref 10–50)
POC MID %: 5.2 %M (ref 0–12)
Platelet Count, POC: 249 10*3/uL (ref 142–424)
RBC: 4.69 M/uL (ref 4.04–5.48)
RDW, POC: 13.6 %
WBC: 13 10*3/uL — AB (ref 4.6–10.2)

## 2014-09-28 LAB — COMPREHENSIVE METABOLIC PANEL
ALT: 23 U/L (ref 0–35)
AST: 29 U/L (ref 0–37)
Alkaline Phosphatase: 64 U/L (ref 39–117)
BUN: 7 mg/dL (ref 6–23)
Calcium: 8.9 mg/dL (ref 8.4–10.5)
Chloride: 104 mEq/L (ref 96–112)
Creat: 0.75 mg/dL (ref 0.50–1.10)
Glucose, Bld: 67 mg/dL — ABNORMAL LOW (ref 70–99)
Total Bilirubin: 0.7 mg/dL (ref 0.2–1.2)

## 2014-09-28 LAB — COMPREHENSIVE METABOLIC PANEL WITH GFR
Albumin: 4 g/dL (ref 3.5–5.2)
CO2: 22 meq/L (ref 19–32)
Potassium: 3.9 meq/L (ref 3.5–5.3)
Sodium: 137 meq/L (ref 135–145)
Total Protein: 7.2 g/dL (ref 6.0–8.3)

## 2014-09-28 MED ORDER — HYDROCODONE-ACETAMINOPHEN 5-325 MG PO TABS: 1.0000 | ORAL_TABLET | Freq: Three times a day (TID) | ORAL | Status: DC | PRN

## 2014-09-28 MED ORDER — CYCLOBENZAPRINE HCL 5 MG PO TABS: 5.0000 mg | ORAL_TABLET | Freq: Three times a day (TID) | ORAL | Status: DC | PRN

## 2014-09-28 NOTE — Patient Instructions (Signed)
Costochondritis Costochondritis, sometimes called Tietze syndrome, is a swelling and irritation (inflammation) of the tissue (cartilage) that connects your ribs with your breastbone (sternum). It causes pain in the chest and rib area. Costochondritis usually goes away on its own over time. It can take up to 6 weeks or longer to get better, especially if you are unable to limit your activities. CAUSES  Some cases of costochondritis have no known cause. Possible causes include:  Injury (trauma).  Exercise or activity such as lifting.  Severe coughing. SIGNS AND SYMPTOMS  Pain and tenderness in the chest and rib area.  Pain that gets worse when coughing or taking deep breaths.  Pain that gets worse with specific movements. DIAGNOSIS  Your health care provider will do a physical exam and ask about your symptoms. Chest X-rays or other tests may be done to rule out other problems. TREATMENT  Costochondritis usually goes away on its own over time. Your health care provider may prescribe medicine to help relieve pain. HOME CARE INSTRUCTIONS   Avoid exhausting physical activity. Try not to strain your ribs during normal activity. This would include any activities using chest, abdominal, and side muscles, especially if heavy weights are used.  Apply ice to the affected area for the first 2 days after the pain begins.  Put ice in a plastic bag.  Place a towel between your skin and the bag.  Leave the ice on for 20 minutes, 2-3 times a day.  Only take over-the-counter or prescription medicines as directed by your health care provider. SEEK MEDICAL CARE IF:  You have redness or swelling at the rib joints. These are signs of infection.  Your pain does not go away despite rest or medicine. SEEK IMMEDIATE MEDICAL CARE IF:   Your pain increases or you are very uncomfortable.  You have shortness of breath or difficulty breathing.  You cough up blood.  You have worse chest pains,  sweating, or vomiting.  You have a fever or persistent symptoms for more than 2-3 days.  You have a fever and your symptoms suddenly get worse. MAKE SURE YOU:   Understand these instructions.  Will watch your condition.  Will get help right away if you are not doing well or get worse. Document Released: 09/02/2005 Document Revised: 09/13/2013 Document Reviewed: 06/27/2013 ExitCare Patient Information 2015 [employer]. This information is not intended to replace advice given to you by your health care provider. Make sure you discuss any questions you have with your health care provider.  

## 2014-09-28 NOTE — Progress Notes (Signed)
Chief Complaint:  Chief Complaint  Patient presents with  . Chest Pain    with breathing x2 days none stop    HPI: Sarah Morrison is a 23 y.o. female who is here for chest pain for the last several weeks  she came in because it was more constant. She has the pain once every two weeks, but recently it has been worse. The pain has been constant for the past 24 hours. The pain is under her left breast. When she takes a deep breath she will get a sharp pain. When she gets the sharp pain it makes it hard for her to breath. Pt unsure of family hx of heart disease. Pt does not take any OTC medications for the pain. The chest pain does usually not last this long. It is usually a sharp pain that comes and goes real quick. NO diaphoresis, nausea, vomiting, palpitations.  She has GERD possibly bit not sure, new activites with step competition but has been practicing for the last 2 weeks without pain, she did lift up some heavy bags the other day. She does have pain with certain ROM   Past Medical History  Diagnosis Date  . Gastritis   . Dermoid cyst of ovary, 6.1cm by CT [id number] 10/11/2012  . Ileitis, probable Crohn's disease 10/11/2012    CT 10/11/2012: Marked inflammatory findings in the terminal ileum and adjacent  cecum, with enlarged pericecal lymph nodes. The appearance favors  Crohn's disease/terminal ileitis, with infectious enterocolitis  less likely given the segmental involvement. No definite  extraluminal gas although there is mild complex ascites in the  pelvis.     Past Surgical History  Procedure Laterality Date  . 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 Esophagogastroduodenoscopy    . Colonoscopy  10/12/2012    Procedure: COLONOSCOPY;  Surgeon: Inda Castle, MD;  Location: [facility];  Service: Endoscopy;  Laterality: N/A;  . Laparotomy N/A 06/16/2013    Procedure: EXPLORATORY LAPAROTOMY With Removal of Left Ovarian Dermoid  Cyst;  Surgeon: Thurnell Lose, MD;  Location: [facility];  Service: Gynecology;  Laterality: N/A;   History   Social History  . Marital Status: Single    Spouse Name: N/A    Number of Children: 0  . Years of Education: N/A   Occupational History  . student    Social History Main Topics  . Smoking status: Never Smoker   . Smokeless tobacco: Never Used  . Alcohol Use: No  . Drug Use: No  . Sexual Activity: Yes    Birth Control/ Protection: Pill   Other Topics Concern  . None   Social History Narrative  . None   Family History  Problem Relation Age of Onset  . Adopted: Yes  . Colon cancer Neg Hx    No Known Allergies Prior to Admission medications   Medication Sig Start Date End Date Taking? Authorizing Provider  norelgestromin-ethinyl estradiol (ORTHO EVRA) 150-35 MCG/24HR transdermal patch Place 1 patch onto the skin once a week.   Yes Historical Provider, MD  HYDROcodone-acetaminophen (NORCO/VICODIN) 5-325 MG per tablet Take 1-2 tablets by mouth every 4 (four) hours as needed for moderate pain. 06/28/14   Ruthell Rummage Dammen, PA-C  inFLIXimab (REMICADE) 100 MG injection Inject into the vein every 8 (eight) weeks.     Historical Provider, MD  mesalamine (LIALDA) 1.2 G EC tablet Take 4.8 g by mouth daily with breakfast.  Historical Provider, MD  ondansetron (ZOFRAN ODT) 4 MG disintegrating tablet Take 1 tablet (4 mg total) by mouth every 8 (eight) hours as needed for nausea or vomiting. 06/28/14   Ruthell Rummage Dammen, PA-C  predniSONE (DELTASONE) 20 MG tablet Take 1 tablet (20 mg total) by mouth daily. 06/28/14   Ruthell Rummage Dammen, PA-C     ROS: The patient denies fevers, chills, night sweats, unintentional weight loss, palpitations, wheezing, dyspnea on exertion, nausea, vomiting, abdominal pain, dysuria, hematuria,  numbness, weakness, or tingling.   All other systems have been reviewed and were otherwise negative with the exception of those mentioned in the HPI and as above.     PHYSICAL EXAM: Filed Vitals:   09/28/14 1228  BP: 108/72  Pulse: 98  Temp: 98.2 F (36.8 C)  Resp: 18   Filed Vitals:   09/28/14 1228  Height: 5\' 8"  (1.727 m)  Weight: 201 lb (91.173 kg)   Body mass index is 30.57 kg/(m^2).  General: Alert, no acute distress HEENT:  Normocephalic, atraumatic, oropharynx patent. EOMI, PERRLA. Cardiovascular:  Regular rate and rhythm, no rubs murmurs or gallops.  No Carotid bruits, radial pulse intact. No pedal edema. + chest wall tenderness on palpation and ROM  Respiratory: Clear to auscultation bilaterally.  No wheezes, rales, or rhonchi.  No cyanosis, no use of accessory musculature GI: No organomegaly, abdomen is soft and non-tender, positive bowel sounds.  No masses. Skin: No rashes. Breast exam was normal , she has a nipple ring, there is no e.o infection  Neurologic: Facial musculature symmetric. Psychiatric: Patient is appropriate throughout our interaction. Lymphatic: No cervical lymphadenopathy Musculoskeletal: Gait intact. 5/5 strength UE and LE, full ROm of shouders, neg shoulder and neck exam    LABS: Results for orders placed in visit on 09/28/14  POCT CBC      Result Value Ref Range   WBC 13.0 (*) 4.6 - 10.2 K/uL   Lymph, poc 3.2  0.6 - 3.4   POC LYMPH PERCENT 24.6  10 - 50 %L   MID (cbc) 0.7  0 - 0.9   POC MID % 5.2  0 - 12 %M   POC Granulocyte 9.1 (*) 2 - 6.9   Granulocyte percent 70.2  37 - 80 %G   RBC 4.69  4.04 - 5.48 M/uL   Hemoglobin 12.9  12.2 - 16.2 g/dL   HCT, POC 39.5  37.7 - 47.9 %   MCV 84.2  80 - 97 fL   MCH, POC 27.6  27 - 31.2 pg   MCHC 32.7  31.8 - 35.4 g/dL   RDW, POC 13.6     Platelet Count, POC 249  142 - 424 K/uL   MPV 7.6  0 - 99.8 fL     EKG/XRAY:   Primary read interpreted by Dr. Marin Comment at [facility]. No acute cardiopulm process   ASSESSMENT/PLAN: Encounter Diagnoses  Name Primary?  . Chest pain, unspecified chest pain type   . Crohn's colitis, unspecified complication   . Costochondral  chest pain Yes   23 y/o female  With PMH crohn's colitis who presents with atypical Cp most likely costochondritis in etiology She has been workign out a lot for a step competition, she also lifted some heavy bags a few days ago Can't take NSAIDs due to crohns disease Rx : Norco, Flexeril Constipation precautions  And drowsiness precuatiosn given, no extra tylenol if taking norco regular Her leukocytosis is most likely related to her Crohn's she manages it with  the mesalamine prn and the infliximab Gross sideeffects, risk and benefits, and alternatives of medications d/w patient. Patient is aware that all medications have potential sideeffects and we are unable to predict every sideeffect or drug-drug interaction that may occur.  LE, [place], DO 09/28/2014 2:00 PM

## 2014-10-25 ENCOUNTER — Other Ambulatory Visit (HOSPITAL_COMMUNITY): Payer: Self-pay | Admitting: *Deleted

## 2014-10-26 ENCOUNTER — Encounter (HOSPITAL_COMMUNITY)
Admission: RE | Admit: 2014-10-26 | Discharge: 2014-10-26 | Disposition: A | Discharge status: Home / Self Care | Payer: Managed Care, Other (non HMO) | Source: Ambulatory Visit | Attending: Gastroenterology | Admitting: Gastroenterology

## 2014-10-26 DIAGNOSIS — K50919 Crohn's disease, unspecified, with unspecified complications: Secondary | ICD-10-CM | POA: Diagnosis not present

## 2014-10-26 LAB — CBC WITH DIFFERENTIAL/PLATELET
BASOS ABS: 0 10*3/uL (ref 0.0–0.1)
Basophils Relative: 1 % (ref 0–1)
Eosinophils Absolute: 0.2 10*3/uL (ref 0.0–0.7)
Eosinophils Relative: 2 % (ref 0–5)
HCT: 36.4 % (ref 36.0–46.0)
Hemoglobin: 12.5 g/dL (ref 12.0–15.0)
LYMPHS ABS: 3.2 10*3/uL (ref 0.7–4.0)
LYMPHS PCT: 42 % (ref 12–46)
MCH: 27.8 pg (ref 26.0–34.0)
MCHC: 34.3 g/dL (ref 30.0–36.0)
MCV: 80.9 fL (ref 78.0–100.0)
Monocytes Absolute: 0.6 10*3/uL (ref 0.1–1.0)
Monocytes Relative: 8 % (ref 3–12)
NEUTROS PCT: 47 % (ref 43–77)
Neutro Abs: 3.6 10*3/uL (ref 1.7–7.7)
PLATELETS: 247 10*3/uL (ref 150–400)
RBC: 4.5 MIL/uL (ref 3.87–5.11)
RDW: 12.8 % (ref 11.5–15.5)
WBC: 7.6 10*3/uL (ref 4.0–10.5)

## 2014-10-26 LAB — COMPREHENSIVE METABOLIC PANEL
ALT: 14 U/L (ref 0–35)
AST: 20 U/L (ref 0–37)
Albumin: 3.3 g/dL — ABNORMAL LOW (ref 3.5–5.2)
Alkaline Phosphatase: 66 U/L (ref 39–117)
Anion gap: 14 (ref 5–15)
BUN: 9 mg/dL (ref 6–23)
CO2: 22 meq/L (ref 19–32)
Calcium: 8.6 mg/dL (ref 8.4–10.5)
Chloride: 105 mEq/L (ref 96–112)
Creatinine, Ser: 0.63 mg/dL (ref 0.50–1.10)
GFR calc Af Amer: >90 mL/min (ref >90)
GLUCOSE: 90 mg/dL (ref 70–99)
POTASSIUM: 3.8 meq/L (ref 3.7–5.3)
SODIUM: 141 meq/L (ref 137–147)
Total Bilirubin: 0.3 mg/dL (ref 0.3–1.2)
Total Protein: 7.7 g/dL (ref 6.0–8.3)

## 2014-10-26 MED ORDER — ACETAMINOPHEN 325 MG PO TABS
650.0000 mg | ORAL_TABLET | ORAL | Status: DC
  Administered 2014-10-26: 650 mg via ORAL

## 2014-10-26 MED ORDER — ACETAMINOPHEN 325 MG PO TABS
ORAL_TABLET | ORAL | Status: AC
  Administered 2014-10-26: 650 mg via ORAL
  Filled 2014-10-26: qty 2

## 2014-10-26 MED ORDER — SODIUM CHLORIDE 0.9 % IV SOLN
INTRAVENOUS | Status: DC
  Administered 2014-10-26: 250 mL via INTRAVENOUS

## 2014-10-26 MED ORDER — SODIUM CHLORIDE 0.9 % IV SOLN
10.0000 mg/kg | INTRAVENOUS | Status: DC
  Administered 2014-10-26: 900 mg via INTRAVENOUS
  Filled 2014-10-26: qty 90

## 2014-10-26 MED ORDER — DIPHENHYDRAMINE HCL 50 MG/ML IJ SOLN
INTRAMUSCULAR | Status: AC
  Administered 2014-10-26: 50 mg via INTRAVENOUS
  Filled 2014-10-26: qty 1

## 2014-10-26 MED ORDER — DIPHENHYDRAMINE HCL 50 MG/ML IJ SOLN
50.0000 mg | INTRAMUSCULAR | Status: DC
  Administered 2014-10-26: 50 mg via INTRAVENOUS

## 2014-11-25 ENCOUNTER — Emergency Department (HOSPITAL_COMMUNITY)
Admission: EM | Admit: 2014-11-25 | Discharge: 2014-11-25 | Disposition: A | Discharge status: Home / Self Care | Payer: Managed Care, Other (non HMO) | Attending: Emergency Medicine | Admitting: Emergency Medicine

## 2014-11-25 ENCOUNTER — Encounter (HOSPITAL_COMMUNITY): Payer: Self-pay | Admitting: *Deleted

## 2014-11-25 DIAGNOSIS — R112 Nausea with vomiting, unspecified: Secondary | ICD-10-CM | POA: Diagnosis not present

## 2014-11-25 DIAGNOSIS — R1084 Generalized abdominal pain: Secondary | ICD-10-CM | POA: Insufficient documentation

## 2014-11-25 DIAGNOSIS — Z79899 Other long term (current) drug therapy: Secondary | ICD-10-CM | POA: Insufficient documentation

## 2014-11-25 DIAGNOSIS — Z86018 Personal history of other benign neoplasm: Secondary | ICD-10-CM | POA: Diagnosis not present

## 2014-11-25 DIAGNOSIS — R000 Tachycardia, unspecified: Secondary | ICD-10-CM | POA: Diagnosis not present

## 2014-11-25 DIAGNOSIS — R1110 Vomiting, unspecified: Secondary | ICD-10-CM | POA: Diagnosis present

## 2014-11-25 DIAGNOSIS — Z3202 Encounter for pregnancy test, result negative: Secondary | ICD-10-CM | POA: Insufficient documentation

## 2014-11-25 DIAGNOSIS — R197 Diarrhea, unspecified: Secondary | ICD-10-CM | POA: Insufficient documentation

## 2014-11-25 DIAGNOSIS — Z8719 Personal history of other diseases of the digestive system: Secondary | ICD-10-CM | POA: Insufficient documentation

## 2014-11-25 LAB — COMPREHENSIVE METABOLIC PANEL
ALT: 24 U/L (ref 0–35)
ANION GAP: 14 (ref 5–15)
AST: 24 U/L (ref 0–37)
Albumin: 3.6 g/dL (ref 3.5–5.2)
Alkaline Phosphatase: 67 U/L (ref 39–117)
BILIRUBIN TOTAL: 0.7 mg/dL (ref 0.3–1.2)
BUN: 7 mg/dL (ref 6–23)
CHLORIDE: 101 meq/L (ref 96–112)
CO2: 22 mEq/L (ref 19–32)
CREATININE: 0.59 mg/dL (ref 0.50–1.10)
Calcium: 8.9 mg/dL (ref 8.4–10.5)
GFR calc non Af Amer: >90 mL/min (ref >90)
Glucose, Bld: 100 mg/dL — ABNORMAL HIGH (ref 70–99)
POTASSIUM: 4 meq/L (ref 3.7–5.3)
Sodium: 137 mEq/L (ref 137–147)
Total Protein: 8 g/dL (ref 6.0–8.3)

## 2014-11-25 LAB — CBC WITH DIFFERENTIAL/PLATELET
Basophils Absolute: 0 10*3/uL (ref 0.0–0.1)
Basophils Relative: 0 % (ref 0–1)
Eosinophils Absolute: 0.1 10*3/uL (ref 0.0–0.7)
Eosinophils Relative: 1 % (ref 0–5)
HEMATOCRIT: 37.9 % (ref 36.0–46.0)
Hemoglobin: 13.1 g/dL (ref 12.0–15.0)
LYMPHS ABS: 0.7 10*3/uL (ref 0.7–4.0)
Lymphocytes Relative: 6 % — ABNORMAL LOW (ref 12–46)
MCH: 27.8 pg (ref 26.0–34.0)
MCHC: 34.6 g/dL (ref 30.0–36.0)
MCV: 80.3 fL (ref 78.0–100.0)
MONO ABS: 0.7 10*3/uL (ref 0.1–1.0)
MONOS PCT: 6 % (ref 3–12)
NEUTROS ABS: 10.9 10*3/uL — AB (ref 1.7–7.7)
NEUTROS PCT: 87 % — AB (ref 43–77)
Platelets: 216 10*3/uL (ref 150–400)
RBC: 4.72 MIL/uL (ref 3.87–5.11)
RDW: 12.8 % (ref 11.5–15.5)
WBC: 12.5 10*3/uL — ABNORMAL HIGH (ref 4.0–10.5)

## 2014-11-25 LAB — URINALYSIS, ROUTINE W REFLEX MICROSCOPIC
BILIRUBIN URINE: NEGATIVE
Glucose, UA: NEGATIVE mg/dL
Ketones, ur: 15 mg/dL — AB
Leukocytes, UA: NEGATIVE
Nitrite: NEGATIVE
PROTEIN: NEGATIVE mg/dL
Specific Gravity, Urine: 1.024 (ref 1.005–1.030)
UROBILINOGEN UA: 1 mg/dL (ref 0.0–1.0)
pH: 7 (ref 5.0–8.0)

## 2014-11-25 LAB — URINE MICROSCOPIC-ADD ON

## 2014-11-25 LAB — POC URINE PREG, ED: Preg Test, Ur: NEGATIVE

## 2014-11-25 LAB — I-STAT CG4 LACTIC ACID, ED: LACTIC ACID, VENOUS: 0.87 mmol/L (ref 0.5–2.2)

## 2014-11-25 MED ORDER — ONDANSETRON HCL 4 MG/2ML IJ SOLN: 4.0000 mg | Freq: Once | INTRAMUSCULAR | Status: DC

## 2014-11-25 MED ORDER — SODIUM CHLORIDE 0.9 % IV BOLUS (SEPSIS)
1000.0000 mL | Freq: Once | INTRAVENOUS | Status: AC
Start: 2014-11-25 — End: 2014-11-25
  Administered 2014-11-25: 1000 mL via INTRAVENOUS

## 2014-11-25 MED ORDER — ACETAMINOPHEN 325 MG PO TABS
650.0000 mg | ORAL_TABLET | Freq: Once | ORAL | Status: AC
  Administered 2014-11-25: 650 mg via ORAL
  Filled 2014-11-25: qty 2

## 2014-11-25 MED ORDER — ONDANSETRON HCL 4 MG/2ML IJ SOLN
4.0000 mg | Freq: Once | INTRAMUSCULAR | Status: AC
  Administered 2014-11-25: 4 mg via INTRAVENOUS
  Filled 2014-11-25: qty 2

## 2014-11-25 MED ORDER — HYDROCODONE-ACETAMINOPHEN 5-325 MG PO TABS: 1.0000 | ORAL_TABLET | Freq: Four times a day (QID) | ORAL | Status: DC | PRN

## 2014-11-25 MED ORDER — MORPHINE SULFATE 4 MG/ML IJ SOLN
4.0000 mg | Freq: Once | INTRAMUSCULAR | Status: AC
  Administered 2014-11-25: 4 mg via INTRAVENOUS
  Filled 2014-11-25: qty 1

## 2014-11-25 MED ORDER — ONDANSETRON HCL 4 MG PO TABS: 4.0000 mg | ORAL_TABLET | Freq: Four times a day (QID) | ORAL | Status: DC

## 2014-11-25 NOTE — Discharge Instructions (Signed)

## 2014-11-25 NOTE — ED Notes (Signed)
Denies having anymore nausea at this time.  States I don't need anything else at this time.  Mother at the bedside.  Encouraged to call for assistance as needed.

## 2014-11-25 NOTE — ED Notes (Signed)
Pt reports n/v/d since yesterday and fever/chills.

## 2014-11-25 NOTE — ED Notes (Signed)
MD at the bedside at this time.

## 2014-11-26 NOTE — ED Provider Notes (Addendum)
CSN: [id number]     Arrival date & time 11/25/14  1913 History   First MD Initiated Contact with Patient 11/25/14 1944     Chief Complaint  Patient presents with  . Emesis  . Chills     (Consider location/radiation/quality/duration/timing/severity/associated sxs/prior Treatment) HPI Comments:  presents with nausea, vomiting, diarrhea, chills and myalgias since yesterday.  Emesis is nonbloody and nonbilious.  Stool is nonbloody.  She has had a low-grade temp.  She's had no sick contacts, recent antibiotic or recent hospitalization.   The history is provided by the patient. No language interpreter was used.    Past Medical History  Diagnosis Date  . Gastritis   . Dermoid cyst of ovary, 6.1cm by CT [id number] 10/11/2012  . Ileitis, probable Crohn's disease 10/11/2012    CT 10/11/2012: Marked inflammatory findings in the terminal ileum and adjacent  cecum, with enlarged pericecal lymph nodes. The appearance favors  Crohn's disease/terminal ileitis, with infectious enterocolitis  less likely given the segmental involvement. No definite  extraluminal gas although there is mild complex ascites in the  pelvis.     Past Surgical History  Procedure Laterality Date  . 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 Esophagogastroduodenoscopy    . Colonoscopy  10/12/2012    Procedure: COLONOSCOPY;  Surgeon: Inda Castle, MD;  Location: [facility];  Service: Endoscopy;  Laterality: N/A;  . Laparotomy N/A 06/16/2013    Procedure: EXPLORATORY LAPAROTOMY With Removal of Left Ovarian Dermoid Cyst;  Surgeon: Thurnell Lose, MD;  Location: [facility];  Service: Gynecology;  Laterality: N/A;   Family History  Problem Relation Age of Onset  . Adopted: Yes  . Colon cancer Neg Hx    History  Substance Use Topics  . Smoking status: Never Smoker   . Smokeless tobacco: Never Used  . Alcohol Use: No   OB History    Gravida Para Term Preterm AB TAB SAB Ectopic  Multiple Living   0              Review of Systems  Constitutional: Negative for fever, chills, diaphoresis, activity change, appetite change and fatigue.  HENT: Negative for congestion, facial swelling, rhinorrhea and sore throat.   Eyes: Negative for photophobia and discharge.  Respiratory: Negative for cough, chest tightness and shortness of breath.   Cardiovascular: Negative for chest pain, palpitations and leg swelling.  Gastrointestinal: Positive for nausea, vomiting, abdominal pain and diarrhea.  Endocrine: Negative for polydipsia and polyuria.  Genitourinary: Negative for dysuria, frequency, difficulty urinating and pelvic pain.  Musculoskeletal: Negative for back pain, arthralgias, neck pain and neck stiffness.  Skin: Negative for color change and wound.  Allergic/Immunologic: Negative for immunocompromised state.  Neurological: Negative for facial asymmetry, weakness, numbness and headaches.  Hematological: Does not bruise/bleed easily.  Psychiatric/Behavioral: Negative for confusion and agitation.      Allergies  Review of patient's allergies indicates no known allergies.  Home Medications   Prior to Admission medications   Medication Sig Start Date End Date Taking? Authorizing Provider  cyclobenzaprine (FLEXERIL) 5 MG tablet Take 1 tablet (5 mg total) by mouth 3 (three) times daily as needed for muscle spasms. 09/28/14   Thao P Le, DO  HYDROcodone-acetaminophen (NORCO) 5-325 MG per tablet Take 1 tablet by mouth every 6 (six) hours as needed. 11/25/14   Ernestina Patches, MD  HYDROcodone-acetaminophen (NORCO/VICODIN) 5-325 MG per tablet Take 1-2 tablets by mouth every 8 (eight) hours as needed for  moderate pain. 09/28/14   Thao P Le, DO  inFLIXimab (REMICADE) 100 MG injection Inject into the vein every 8 (eight) weeks.     Historical Provider, MD  mesalamine (LIALDA) 1.2 G EC tablet Take 4.8 g by mouth daily with breakfast.    Historical Provider, MD  norelgestromin-ethinyl  estradiol (ORTHO EVRA) 150-35 MCG/24HR transdermal patch Place 1 patch onto the skin once a week.    Historical Provider, MD  ondansetron (ZOFRAN) 4 MG tablet Take 1 tablet (4 mg total) by mouth every 6 (six) hours. 11/25/14   Ernestina Patches, MD   BP 127/76 mmHg  Pulse 100  Temp(Src) 99.4 F (37.4 C) (Oral)  Resp 18  SpO2 100%  LMP 11/25/2014 Physical Exam  Constitutional: She is oriented to person, place, and time. She appears well-developed and well-nourished. No distress.  HENT:  Head: Normocephalic and atraumatic.  Mouth/Throat: No oropharyngeal exudate.  Eyes: Pupils are equal, round, and reactive to light.  Neck: Normal range of motion. Neck supple.  Cardiovascular: Normal rate, regular rhythm and normal heart sounds.  Exam reveals no gallop and no friction rub.   No murmur heard. Pulmonary/Chest: Effort normal and breath sounds normal. No respiratory distress. She has no wheezes. She has no rales.  Abdominal: Soft. Bowel sounds are normal. She exhibits no distension and no mass. There is generalized tenderness. There is no rigidity, no rebound and no guarding.  Musculoskeletal: Normal range of motion. She exhibits no edema or tenderness.  Neurological: She is alert and oriented to person, place, and time.  Skin: Skin is warm and dry.  Psychiatric: She has a normal mood and affect.    ED Course  Procedures (including critical care time) Labs Review Labs Reviewed  CBC WITH DIFFERENTIAL - Abnormal; Notable for the following:    WBC 12.5 (*)    Neutrophils Relative % 87 (*)    Neutro Abs 10.9 (*)    Lymphocytes Relative 6 (*)    All other components within normal limits  COMPREHENSIVE METABOLIC PANEL - Abnormal; Notable for the following:    Glucose, Bld 100 (*)    All other components within normal limits  URINALYSIS, ROUTINE W REFLEX MICROSCOPIC - Abnormal; Notable for the following:    APPearance CLOUDY (*)    Hgb urine dipstick LARGE (*)    Ketones, ur 15 (*)    All  other components within normal limits  URINE MICROSCOPIC-ADD ON - Abnormal; Notable for the following:    Squamous Epithelial / LPF FEW (*)    All other components within normal limits  URINE CULTURE  POC URINE PREG, ED  I-STAT CG4 LACTIC ACID, ED    Imaging Review No results found.   EKG Interpretation None      MDM   Final diagnoses:  Nausea vomiting and diarrhea    Pt is a 23 y.o. female with Pmhx as above who presents with nausea, vomiting, diarrhea, chills and myalgias since yesterday.  Emesis is nonbloody and nonbilious.  Stool is nonbloody.  She has had a low-grade temp.  She's had no sick contacts, recent antibiotic or recent hospitalization.  On physical exam, patient initially mildly tachycardic but in no acute distress.  She is mild generalized abdominal tenderness to palpation without rebound or guarding. WBC was mildly elevated 12.5, CMP otherwise normal.  CMP nml, LA nml, UA not infected.  Patient feeling improved after 1 L normal saline, IV Zofran and 1 dose of IV morphine.  Heart rate is normalized.  I do not feel symptoms are consistent with acute Crohn's flare and more likely patient has acute viral syndrome such as influenza or gastroenteritis.  Prescription for Zofran given    Levonne Hubert evaluation in the Emergency Department is complete. It has been determined that no acute conditions requiring further emergency intervention are present at this time. The patient/guardian have been advised of the diagnosis and plan. We have discussed signs and symptoms that warrant return to the ED, such as changes or worsening in symptoms, worsening pain, inability to tolerate liquids      Ernestina Patches, MD 11/26/14 3005  Ernestina Patches, MD 12/19/14 1255

## 2014-11-27 LAB — URINE CULTURE

## 2014-12-17 ENCOUNTER — Other Ambulatory Visit: Payer: Self-pay | Admitting: Gastroenterology

## 2014-12-17 DIAGNOSIS — K5090 Crohn's disease, unspecified, without complications: Secondary | ICD-10-CM

## 2014-12-17 DIAGNOSIS — R1084 Generalized abdominal pain: Secondary | ICD-10-CM

## 2014-12-20 ENCOUNTER — Other Ambulatory Visit (HOSPITAL_COMMUNITY): Payer: Self-pay | Admitting: *Deleted

## 2014-12-20 ENCOUNTER — Other Ambulatory Visit: Payer: Managed Care, Other (non HMO)

## 2014-12-21 ENCOUNTER — Encounter (HOSPITAL_COMMUNITY)
Admission: RE | Admit: 2014-12-21 | Discharge: 2014-12-21 | Disposition: A | Discharge status: Home / Self Care | Payer: Managed Care, Other (non HMO) | Source: Ambulatory Visit | Attending: Gastroenterology | Admitting: Gastroenterology

## 2014-12-21 DIAGNOSIS — K50919 Crohn's disease, unspecified, with unspecified complications: Secondary | ICD-10-CM | POA: Insufficient documentation

## 2014-12-21 LAB — COMPREHENSIVE METABOLIC PANEL
ALBUMIN: 3.2 g/dL — AB (ref 3.5–5.2)
ALK PHOS: 57 U/L (ref 39–117)
ALT: 19 U/L (ref 0–35)
AST: 24 U/L (ref 0–37)
Anion gap: 12 (ref 5–15)
BUN: 8 mg/dL (ref 6–23)
CO2: 23 mmol/L (ref 19–32)
Calcium: 8.8 mg/dL (ref 8.4–10.5)
Chloride: 104 mEq/L (ref 96–112)
Creatinine, Ser: 0.66 mg/dL (ref 0.50–1.10)
GFR calc non Af Amer: >90 mL/min (ref >90)
Glucose, Bld: 91 mg/dL (ref 70–99)
POTASSIUM: 4 mmol/L (ref 3.5–5.1)
SODIUM: 139 mmol/L (ref 135–145)
Total Bilirubin: 0.4 mg/dL (ref 0.3–1.2)
Total Protein: 6.8 g/dL (ref 6.0–8.3)

## 2014-12-21 LAB — CBC WITH DIFFERENTIAL/PLATELET
BASOS ABS: 0 10*3/uL (ref 0.0–0.1)
BASOS PCT: 0 % (ref 0–1)
EOS ABS: 0.2 10*3/uL (ref 0.0–0.7)
Eosinophils Relative: 2 % (ref 0–5)
HEMATOCRIT: 36.1 % (ref 36.0–46.0)
Hemoglobin: 12.2 g/dL (ref 12.0–15.0)
Lymphocytes Relative: 26 % (ref 12–46)
Lymphs Abs: 3.1 10*3/uL (ref 0.7–4.0)
MCH: 28 pg (ref 26.0–34.0)
MCHC: 33.8 g/dL (ref 30.0–36.0)
MCV: 82.8 fL (ref 78.0–100.0)
MONO ABS: 0.8 10*3/uL (ref 0.1–1.0)
MONOS PCT: 7 % (ref 3–12)
Neutro Abs: 8 10*3/uL — ABNORMAL HIGH (ref 1.7–7.7)
Neutrophils Relative %: 65 % (ref 43–77)
Platelets: 219 10*3/uL (ref 150–400)
RBC: 4.36 MIL/uL (ref 3.87–5.11)
RDW: 13.2 % (ref 11.5–15.5)
WBC: 12.2 10*3/uL — AB (ref 4.0–10.5)

## 2014-12-21 MED ORDER — ACETAMINOPHEN 325 MG PO TABS
ORAL_TABLET | ORAL | Status: AC
  Filled 2014-12-21: qty 2

## 2014-12-21 MED ORDER — DIPHENHYDRAMINE HCL 50 MG/ML IJ SOLN
INTRAMUSCULAR | Status: AC
  Filled 2014-12-21: qty 1

## 2014-12-21 MED ORDER — DIPHENHYDRAMINE HCL 50 MG/ML IJ SOLN
50.0000 mg | INTRAMUSCULAR | Status: DC
  Administered 2014-12-21: 50 mg via INTRAVENOUS

## 2014-12-21 MED ORDER — ACETAMINOPHEN 325 MG PO TABS
650.0000 mg | ORAL_TABLET | ORAL | Status: DC
  Administered 2014-12-21: 650 mg via ORAL

## 2014-12-21 MED ORDER — SODIUM CHLORIDE 0.9 % IV SOLN
INTRAVENOUS | Status: DC
  Administered 2014-12-21: 09:00:00 via INTRAVENOUS

## 2014-12-21 MED ORDER — INFLIXIMAB 100 MG IV SOLR
10.0000 mg/kg | INTRAVENOUS | Status: DC
  Administered 2014-12-21: 900 mg via INTRAVENOUS
  Filled 2014-12-21: qty 90

## 2014-12-27 ENCOUNTER — Other Ambulatory Visit: Payer: Self-pay | Admitting: Gastroenterology

## 2015-01-03 ENCOUNTER — Ambulatory Visit
Admission: RE | Admit: 2015-01-03 | Discharge: 2015-01-03 | Disposition: A | Discharge status: Home / Self Care | Payer: Managed Care, Other (non HMO) | Source: Ambulatory Visit | Attending: Gastroenterology | Admitting: Gastroenterology

## 2015-01-03 DIAGNOSIS — K5090 Crohn's disease, unspecified, without complications: Secondary | ICD-10-CM

## 2015-01-03 DIAGNOSIS — R1084 Generalized abdominal pain: Secondary | ICD-10-CM

## 2015-01-03 MED ORDER — IOHEXOL 300 MG/ML  SOLN
125.0000 mL | Freq: Once | INTRAMUSCULAR | Status: AC | PRN
  Administered 2015-01-03: 125 mL via INTRAVENOUS

## 2015-02-13 ENCOUNTER — Other Ambulatory Visit (HOSPITAL_COMMUNITY): Payer: Self-pay | Admitting: *Deleted

## 2015-02-14 ENCOUNTER — Encounter (HOSPITAL_COMMUNITY): Payer: Managed Care, Other (non HMO)

## 2015-02-14 ENCOUNTER — Encounter (HOSPITAL_COMMUNITY)
Admission: RE | Admit: 2015-02-14 | Discharge: 2015-02-14 | Disposition: A | Discharge status: Home / Self Care | Payer: Managed Care, Other (non HMO) | Source: Ambulatory Visit | Attending: Gastroenterology | Admitting: Gastroenterology

## 2015-02-14 DIAGNOSIS — K50919 Crohn's disease, unspecified, with unspecified complications: Secondary | ICD-10-CM | POA: Insufficient documentation

## 2015-02-14 LAB — DIFFERENTIAL
Basophils Absolute: 0 10*3/uL (ref 0.0–0.1)
Basophils Relative: 0 % (ref 0–1)
EOS PCT: 2 % (ref 0–5)
Eosinophils Absolute: 0.2 10*3/uL (ref 0.0–0.7)
Lymphocytes Relative: 34 % (ref 12–46)
Lymphs Abs: 2.8 10*3/uL (ref 0.7–4.0)
MONO ABS: 0.5 10*3/uL (ref 0.1–1.0)
MONOS PCT: 6 % (ref 3–12)
NEUTROS ABS: 4.7 10*3/uL (ref 1.7–7.7)
Neutrophils Relative %: 58 % (ref 43–77)

## 2015-02-14 LAB — CBC
HEMATOCRIT: 35.6 % — AB (ref 36.0–46.0)
Hemoglobin: 12.2 g/dL (ref 12.0–15.0)
MCH: 28 pg (ref 26.0–34.0)
MCHC: 34.3 g/dL (ref 30.0–36.0)
MCV: 81.8 fL (ref 78.0–100.0)
PLATELETS: 266 10*3/uL (ref 150–400)
RBC: 4.35 MIL/uL (ref 3.87–5.11)
RDW: 13 % (ref 11.5–15.5)
WBC: 8.3 10*3/uL (ref 4.0–10.5)

## 2015-02-14 LAB — C-REACTIVE PROTEIN: CRP: 1.1 mg/dL — ABNORMAL HIGH (ref <0.60)

## 2015-02-14 LAB — ALT: ALT: 22 U/L (ref 0–35)

## 2015-02-14 LAB — AST: AST: 30 U/L (ref 0–37)

## 2015-02-14 LAB — ALKALINE PHOSPHATASE: Alkaline Phosphatase: 60 U/L (ref 39–117)

## 2015-02-14 MED ORDER — METHYLPREDNISOLONE SODIUM SUCC 40 MG IJ SOLR
INTRAMUSCULAR | Status: AC
  Administered 2015-02-14: 20 mg
  Filled 2015-02-14: qty 1

## 2015-02-14 MED ORDER — SODIUM CHLORIDE 0.9 % IV SOLN
10.0000 mg/kg | INTRAVENOUS | Status: DC
  Administered 2015-02-14: 900 mg via INTRAVENOUS
  Filled 2015-02-14: qty 90

## 2015-02-14 MED ORDER — SODIUM CHLORIDE 0.9 % IV SOLN
INTRAVENOUS | Status: DC
  Administered 2015-02-14: 09:00:00 via INTRAVENOUS

## 2015-02-14 MED ORDER — LORATADINE 10 MG PO TABS
10.0000 mg | ORAL_TABLET | Freq: Every day | ORAL | Status: DC
  Administered 2015-02-14: 10 mg via ORAL

## 2015-02-14 MED ORDER — METHYLPREDNISOLONE SODIUM SUCC 125 MG IJ SOLR: 20.0000 mg | Freq: Once | INTRAMUSCULAR | Status: DC

## 2015-02-14 MED ORDER — LORATADINE 10 MG PO TABS
ORAL_TABLET | ORAL | Status: AC
  Filled 2015-02-14: qty 1

## 2015-04-11 ENCOUNTER — Encounter (HOSPITAL_COMMUNITY)
Admission: RE | Admit: 2015-04-11 | Discharge: 2015-04-11 | Disposition: A | Discharge status: Home / Self Care | Payer: Managed Care, Other (non HMO) | Source: Ambulatory Visit | Attending: Gastroenterology | Admitting: Gastroenterology

## 2015-04-11 DIAGNOSIS — K50919 Crohn's disease, unspecified, with unspecified complications: Secondary | ICD-10-CM | POA: Insufficient documentation

## 2015-04-11 LAB — DIFFERENTIAL
BASOS ABS: 0 10*3/uL (ref 0.0–0.1)
BASOS PCT: 0 % (ref 0–1)
Eosinophils Absolute: 0.2 10*3/uL (ref 0.0–0.7)
Eosinophils Relative: 2 % (ref 0–5)
Lymphocytes Relative: 30 % (ref 12–46)
Lymphs Abs: 2.4 10*3/uL (ref 0.7–4.0)
MONOS PCT: 7 % (ref 3–12)
Monocytes Absolute: 0.5 10*3/uL (ref 0.1–1.0)
NEUTROS ABS: 4.9 10*3/uL (ref 1.7–7.7)
Neutrophils Relative %: 61 % (ref 43–77)

## 2015-04-11 LAB — CBC
HEMATOCRIT: 36.8 % (ref 36.0–46.0)
HEMOGLOBIN: 12.5 g/dL (ref 12.0–15.0)
MCH: 28.1 pg (ref 26.0–34.0)
MCHC: 34 g/dL (ref 30.0–36.0)
MCV: 82.7 fL (ref 78.0–100.0)
Platelets: 250 10*3/uL (ref 150–400)
RBC: 4.45 MIL/uL (ref 3.87–5.11)
RDW: 13 % (ref 11.5–15.5)
WBC: 8 10*3/uL (ref 4.0–10.5)

## 2015-04-11 LAB — AST: AST: 31 U/L (ref 15–41)

## 2015-04-11 LAB — C-REACTIVE PROTEIN: CRP: 0.6 mg/dL (ref <1.0)

## 2015-04-11 LAB — ALT: ALT: 19 U/L (ref 14–54)

## 2015-04-11 LAB — ALKALINE PHOSPHATASE: Alkaline Phosphatase: 56 U/L (ref 38–126)

## 2015-04-11 MED ORDER — LORATADINE 10 MG PO TABS
10.0000 mg | ORAL_TABLET | Freq: Every day | ORAL | Status: DC
  Administered 2015-04-11: 10 mg via ORAL

## 2015-04-11 MED ORDER — SODIUM CHLORIDE 0.9 % IV SOLN
INTRAVENOUS | Status: DC
  Administered 2015-04-11: 08:00:00 via INTRAVENOUS

## 2015-04-11 MED ORDER — METHYLPREDNISOLONE SODIUM SUCC 40 MG IJ SOLR
INTRAMUSCULAR | Status: AC
  Administered 2015-04-11: 20 mg
  Filled 2015-04-11: qty 1

## 2015-04-11 MED ORDER — SODIUM CHLORIDE 0.9 % IV SOLN
10.0000 mg/kg | INTRAVENOUS | Status: DC
  Administered 2015-04-11: 900 mg via INTRAVENOUS
  Filled 2015-04-11: qty 90

## 2015-04-11 MED ORDER — METHYLPREDNISOLONE SODIUM SUCC 125 MG IJ SOLR: 20.0000 mg | Freq: Once | INTRAMUSCULAR | Status: DC

## 2015-04-11 MED ORDER — LORATADINE 10 MG PO TABS
ORAL_TABLET | ORAL | Status: AC
  Filled 2015-04-11: qty 1

## 2015-06-06 ENCOUNTER — Other Ambulatory Visit (HOSPITAL_COMMUNITY): Payer: Self-pay | Admitting: *Deleted

## 2015-06-07 ENCOUNTER — Inpatient Hospital Stay (HOSPITAL_COMMUNITY): Admission: RE | Admit: 2015-06-07 | Payer: Managed Care, Other (non HMO) | Source: Ambulatory Visit

## 2015-06-24 ENCOUNTER — Encounter (HOSPITAL_COMMUNITY)
Admission: RE | Admit: 2015-06-24 | Discharge: 2015-06-24 | Disposition: A | Discharge status: Home / Self Care | Payer: Managed Care, Other (non HMO) | Source: Ambulatory Visit | Attending: Gastroenterology | Admitting: Gastroenterology

## 2015-06-24 DIAGNOSIS — K50919 Crohn's disease, unspecified, with unspecified complications: Secondary | ICD-10-CM | POA: Diagnosis present

## 2015-06-24 LAB — CBC
HCT: 36.4 % (ref 36.0–46.0)
Hemoglobin: 12.7 g/dL (ref 12.0–15.0)
MCH: 29 pg (ref 26.0–34.0)
MCHC: 34.9 g/dL (ref 30.0–36.0)
MCV: 83.1 fL (ref 78.0–100.0)
PLATELETS: 274 10*3/uL (ref 150–400)
RBC: 4.38 MIL/uL (ref 3.87–5.11)
RDW: 13.5 % (ref 11.5–15.5)
WBC: 6.5 10*3/uL (ref 4.0–10.5)

## 2015-06-24 LAB — ALT: ALT: 21 U/L (ref 14–54)

## 2015-06-24 LAB — AST: AST: 23 U/L (ref 15–41)

## 2015-06-24 LAB — C-REACTIVE PROTEIN: CRP: <0.5 mg/dL (ref <1.0)

## 2015-06-24 MED ORDER — METHYLPREDNISOLONE SODIUM SUCC 40 MG IJ SOLR
INTRAMUSCULAR | Status: AC
  Filled 2015-06-24: qty 1

## 2015-06-24 MED ORDER — SODIUM CHLORIDE 0.9 % IV SOLN
10.0000 mg/kg | INTRAVENOUS | Status: DC
  Administered 2015-06-24: 1000 mg via INTRAVENOUS
  Filled 2015-06-24: qty 100

## 2015-06-24 MED ORDER — LORATADINE 10 MG PO TABS
10.0000 mg | ORAL_TABLET | ORAL | Status: DC
  Administered 2015-06-24: 10 mg via ORAL

## 2015-06-24 MED ORDER — METHYLPREDNISOLONE SODIUM SUCC 125 MG IJ SOLR
INTRAMUSCULAR | Status: AC
  Filled 2015-06-24: qty 2

## 2015-06-24 MED ORDER — LORATADINE 10 MG PO TABS
ORAL_TABLET | ORAL | Status: AC
  Filled 2015-06-24: qty 1

## 2015-06-24 MED ORDER — METHYLPREDNISOLONE SODIUM SUCC 125 MG IJ SOLR
20.0000 mg | INTRAMUSCULAR | Status: DC
Start: 2015-06-24 — End: 2015-06-25
  Administered 2015-06-24: 20 mg via INTRAVENOUS

## 2015-07-16 ENCOUNTER — Emergency Department (HOSPITAL_COMMUNITY)
Admission: EM | Admit: 2015-07-16 | Discharge: 2015-07-16 | Discharge status: Against Medical Advice | Payer: Managed Care, Other (non HMO) | Attending: Emergency Medicine | Admitting: Emergency Medicine

## 2015-07-16 ENCOUNTER — Encounter (HOSPITAL_COMMUNITY): Payer: Self-pay | Admitting: *Deleted

## 2015-07-16 ENCOUNTER — Emergency Department (HOSPITAL_COMMUNITY): Payer: Managed Care, Other (non HMO)

## 2015-07-16 ENCOUNTER — Encounter (HOSPITAL_COMMUNITY): Payer: Self-pay | Admitting: Emergency Medicine

## 2015-07-16 ENCOUNTER — Emergency Department (HOSPITAL_COMMUNITY)
Admission: EM | Admit: 2015-07-16 | Discharge: 2015-07-17 | Disposition: A | Discharge status: Home / Self Care | Payer: Managed Care, Other (non HMO) | Attending: Emergency Medicine | Admitting: Emergency Medicine

## 2015-07-16 DIAGNOSIS — R112 Nausea with vomiting, unspecified: Secondary | ICD-10-CM | POA: Insufficient documentation

## 2015-07-16 DIAGNOSIS — R51 Headache: Secondary | ICD-10-CM | POA: Diagnosis not present

## 2015-07-16 DIAGNOSIS — R1110 Vomiting, unspecified: Secondary | ICD-10-CM | POA: Diagnosis not present

## 2015-07-16 DIAGNOSIS — Z86018 Personal history of other benign neoplasm: Secondary | ICD-10-CM | POA: Insufficient documentation

## 2015-07-16 DIAGNOSIS — R509 Fever, unspecified: Secondary | ICD-10-CM | POA: Insufficient documentation

## 2015-07-16 DIAGNOSIS — R6883 Chills (without fever): Secondary | ICD-10-CM | POA: Insufficient documentation

## 2015-07-16 DIAGNOSIS — Z79899 Other long term (current) drug therapy: Secondary | ICD-10-CM | POA: Insufficient documentation

## 2015-07-16 DIAGNOSIS — R109 Unspecified abdominal pain: Secondary | ICD-10-CM | POA: Insufficient documentation

## 2015-07-16 DIAGNOSIS — Z9049 Acquired absence of other specified parts of digestive tract: Secondary | ICD-10-CM | POA: Insufficient documentation

## 2015-07-16 DIAGNOSIS — K5900 Constipation, unspecified: Secondary | ICD-10-CM | POA: Insufficient documentation

## 2015-07-16 DIAGNOSIS — R1031 Right lower quadrant pain: Secondary | ICD-10-CM | POA: Diagnosis present

## 2015-07-16 LAB — CBC WITH DIFFERENTIAL/PLATELET
BASOS ABS: 0 10*3/uL (ref 0.0–0.1)
BASOS PCT: 0 % (ref 0–1)
EOS PCT: 0 % (ref 0–5)
Eosinophils Absolute: 0 10*3/uL (ref 0.0–0.7)
HEMATOCRIT: 38.3 % (ref 36.0–46.0)
Hemoglobin: 13.3 g/dL (ref 12.0–15.0)
Lymphocytes Relative: 12 % (ref 12–46)
Lymphs Abs: 1.3 10*3/uL (ref 0.7–4.0)
MCH: 29.6 pg (ref 26.0–34.0)
MCHC: 34.7 g/dL (ref 30.0–36.0)
MCV: 85.1 fL (ref 78.0–100.0)
MONOS PCT: 3 % (ref 3–12)
Monocytes Absolute: 0.3 10*3/uL (ref 0.1–1.0)
NEUTROS ABS: 9.3 10*3/uL — AB (ref 1.7–7.7)
Neutrophils Relative %: 85 % — ABNORMAL HIGH (ref 43–77)
PLATELETS: ADEQUATE 10*3/uL (ref 150–400)
RBC: 4.5 MIL/uL (ref 3.87–5.11)
RDW: 13.9 % (ref 11.5–15.5)
WBC: 10.9 10*3/uL — ABNORMAL HIGH (ref 4.0–10.5)

## 2015-07-16 LAB — COMPREHENSIVE METABOLIC PANEL
ALBUMIN: 4.1 g/dL (ref 3.5–5.0)
ALK PHOS: 58 U/L (ref 38–126)
ALK PHOS: 57 U/L (ref 38–126)
ALT: 42 U/L (ref 14–54)
ALT: 46 U/L (ref 14–54)
ANION GAP: 11 (ref 5–15)
AST: 45 U/L — AB (ref 15–41)
AST: 54 U/L — ABNORMAL HIGH (ref 15–41)
Albumin: 4.5 g/dL (ref 3.5–5.0)
Anion gap: 9 (ref 5–15)
BUN: <5 mg/dL — ABNORMAL LOW (ref 6–20)
BUN: 6 mg/dL (ref 6–20)
CHLORIDE: 105 mmol/L (ref 101–111)
CHLORIDE: 106 mmol/L (ref 101–111)
CO2: 23 mmol/L (ref 22–32)
CO2: 21 mmol/L — ABNORMAL LOW (ref 22–32)
CREATININE: 0.89 mg/dL (ref 0.44–1.00)
Calcium: 9.4 mg/dL (ref 8.9–10.3)
Calcium: 9.5 mg/dL (ref 8.9–10.3)
Creatinine, Ser: 0.84 mg/dL (ref 0.44–1.00)
GFR calc Af Amer: >60 mL/min (ref >60)
GFR calc Af Amer: >60 mL/min (ref >60)
Glucose, Bld: 113 mg/dL — ABNORMAL HIGH (ref 65–99)
Glucose, Bld: 116 mg/dL — ABNORMAL HIGH (ref 65–99)
POTASSIUM: 4.1 mmol/L (ref 3.5–5.1)
Potassium: 3.9 mmol/L (ref 3.5–5.1)
Sodium: 137 mmol/L (ref 135–145)
Sodium: 138 mmol/L (ref 135–145)
Total Bilirubin: 1.2 mg/dL (ref 0.3–1.2)
Total Bilirubin: 1.7 mg/dL — ABNORMAL HIGH (ref 0.3–1.2)
Total Protein: 7.7 g/dL (ref 6.5–8.1)
Total Protein: 8.3 g/dL — ABNORMAL HIGH (ref 6.5–8.1)

## 2015-07-16 LAB — CBC
HCT: 36.9 % (ref 36.0–46.0)
Hemoglobin: 13 g/dL (ref 12.0–15.0)
MCH: 29.9 pg (ref 26.0–34.0)
MCHC: 35.2 g/dL (ref 30.0–36.0)
MCV: 84.8 fL (ref 78.0–100.0)
Platelets: 293 10*3/uL (ref 150–400)
RBC: 4.35 MIL/uL (ref 3.87–5.11)
RDW: 13.8 % (ref 11.5–15.5)
WBC: 9.8 10*3/uL (ref 4.0–10.5)

## 2015-07-16 LAB — I-STAT BETA HCG BLOOD, ED (MC, WL, AP ONLY): I-stat hCG, quantitative: <5 m[IU]/mL (ref <5)

## 2015-07-16 LAB — LIPASE, BLOOD: Lipase: 19 U/L — ABNORMAL LOW (ref 22–51)

## 2015-07-16 MED ORDER — DIAZEPAM 5 MG/ML IJ SOLN: 5.0000 mg | Freq: Once | INTRAMUSCULAR | Status: DC

## 2015-07-16 MED ORDER — ONDANSETRON 4 MG PO TBDP
ORAL_TABLET | ORAL | Status: AC
  Filled 2015-07-16: qty 1

## 2015-07-16 MED ORDER — HYDROMORPHONE HCL 1 MG/ML IJ SOLN
1.0000 mg | Freq: Once | INTRAMUSCULAR | Status: AC
  Administered 2015-07-16: 1 mg via INTRAVENOUS
  Filled 2015-07-16: qty 1

## 2015-07-16 MED ORDER — DEXAMETHASONE SODIUM PHOSPHATE 10 MG/ML IJ SOLN: 10.0000 mg | Freq: Once | INTRAMUSCULAR | Status: DC

## 2015-07-16 MED ORDER — DIPHENHYDRAMINE HCL 50 MG/ML IJ SOLN: 25.0000 mg | Freq: Once | INTRAMUSCULAR | Status: DC

## 2015-07-16 MED ORDER — PROMETHAZINE HCL 25 MG/ML IJ SOLN
25.0000 mg | Freq: Once | INTRAMUSCULAR | Status: AC
  Administered 2015-07-16: 25 mg via INTRAVENOUS
  Filled 2015-07-16: qty 1

## 2015-07-16 MED ORDER — SODIUM CHLORIDE 0.9 % IV BOLUS (SEPSIS): 1000.0000 mL | Freq: Once | INTRAVENOUS | Status: DC

## 2015-07-16 MED ORDER — IOHEXOL 300 MG/ML  SOLN
100.0000 mL | Freq: Once | INTRAMUSCULAR | Status: AC | PRN
  Administered 2015-07-16: 100 mL via INTRAVENOUS

## 2015-07-16 MED ORDER — BUPIVACAINE HCL (PF) 0.5 % IJ SOLN: 10.0000 mL | Freq: Once | INTRAMUSCULAR | Status: DC

## 2015-07-16 MED ORDER — METOCLOPRAMIDE HCL 5 MG/ML IJ SOLN: 10.0000 mg | Freq: Once | INTRAMUSCULAR | Status: DC

## 2015-07-16 MED ORDER — ONDANSETRON 4 MG PO TBDP
4.0000 mg | ORAL_TABLET | Freq: Once | ORAL | Status: AC | PRN
  Administered 2015-07-16: 4 mg via ORAL

## 2015-07-16 MED ORDER — SODIUM CHLORIDE 0.9 % IV BOLUS (SEPSIS)
1000.0000 mL | Freq: Once | INTRAVENOUS | Status: AC
Start: 2015-07-16 — End: 2015-07-17
  Administered 2015-07-16: 1000 mL via INTRAVENOUS

## 2015-07-16 MED ORDER — IOHEXOL 300 MG/ML  SOLN
25.0000 mL | Freq: Once | INTRAMUSCULAR | Status: AC | PRN
  Administered 2015-07-16: 25 mL via ORAL

## 2015-07-16 NOTE — ED Provider Notes (Signed)
CSN: [id number]     Arrival date & time 07/16/15  1919 History   First MD Initiated Contact with Patient 07/16/15 2054     Chief Complaint  Patient presents with  . Abdominal Pain  . Nausea  . Emesis     (Consider location/radiation/quality/duration/timing/severity/associated sxs/prior Treatment) HPI   24 year old history of Crohn's disease presents to the emergency department for 3 days of vomiting abdominal pain decreased bowel movements. Also significant intermittent crampy pain as well. Starts in the right upper quadrant when it gets bad it goes everywhere. Doesn't radiate anywhere else just becomes her whole stomach. Vomiting is nonbloody nonbilious. She has a history of placed cystitis status post resection. No recent travels, no sick contacts, similar to previous Crohn's flares aside from severity of the pain. When its worst is 10 out of 10.  Past Medical History  Diagnosis Date  . Gastritis   . Dermoid cyst of ovary, 6.1cm by CT [id number] 10/11/2012  . Ileitis, probable Crohn's disease 10/11/2012    CT 10/11/2012: Marked inflammatory findings in the terminal ileum and adjacent  cecum, with enlarged pericecal lymph nodes. The appearance favors  Crohn's disease/terminal ileitis, with infectious enterocolitis  less likely given the segmental involvement. No definite  extraluminal gas although there is mild complex ascites in the  pelvis.     Past Surgical History  Procedure Laterality Date  . 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 Esophagogastroduodenoscopy    . Colonoscopy  10/12/2012    Procedure: COLONOSCOPY;  Surgeon: Inda Castle, MD;  Location: [facility];  Service: Endoscopy;  Laterality: N/A;  . Laparotomy N/A 06/16/2013    Procedure: EXPLORATORY LAPAROTOMY With Removal of Left Ovarian Dermoid Cyst;  Surgeon: Thurnell Lose, MD;  Location: [facility];  Service: Gynecology;  Laterality: N/A;   Family History  Problem Relation  Age of Onset  . Adopted: Yes  . Colon cancer Neg Hx    Social History  Substance Use Topics  . Smoking status: Never Smoker   . Smokeless tobacco: Never Used  . Alcohol Use: No   OB History    Gravida Para Term Preterm AB TAB SAB Ectopic Multiple Living   0              Review of Systems  Constitutional: Positive for fever and chills.  Gastrointestinal: Positive for nausea, vomiting, abdominal pain and constipation.  All other systems reviewed and are negative.     Allergies  Review of patient's allergies indicates no known allergies.  Home Medications   Prior to Admission medications   Medication Sig Start Date End Date Taking? Authorizing Provider  azaTHIOprine (IMURAN) 50 MG tablet Take 200 mg by mouth daily. 05/09/15  Yes Historical Provider, MD  inFLIXimab (REMICADE) 100 MG injection Inject into the vein every 8 (eight) weeks.    Yes Historical Provider, MD  norelgestromin-ethinyl estradiol (ORTHO EVRA) 150-35 MCG/24HR transdermal patch Place 1 patch onto the skin once a week.   Yes Historical Provider, MD  mesalamine (LIALDA) 1.2 G EC tablet Take 4.8 g by mouth daily with breakfast.    Historical Provider, MD   BP 120/66 mmHg  Pulse 83  Temp(Src) 98.5 F (36.9 C) (Axillary)  Resp 13  SpO2 100%  LMP 07/07/2015 Physical Exam  Constitutional: She is oriented to person, place, and time. She appears well-developed and well-nourished.  HENT:  Head: Normocephalic and atraumatic.  Eyes: Conjunctivae and EOM are normal. Right eye  exhibits no discharge. Left eye exhibits no discharge.  Cardiovascular: Normal rate and regular rhythm.   Pulmonary/Chest: Effort normal and breath sounds normal. No respiratory distress.  Abdominal: Soft. She exhibits no distension. There is tenderness. There is no rebound and no guarding.  Musculoskeletal: Normal range of motion. She exhibits no edema or tenderness.  Neurological: She is alert and oriented to person, place, and time.  Skin:  Skin is warm and dry.  Nursing note and vitals reviewed.   ED Course  Procedures (including critical care time) Labs Review Labs Reviewed  CBC WITH DIFFERENTIAL/PLATELET - Abnormal; Notable for the following:    WBC 10.9 (*)    Neutrophils Relative % 85 (*)    Neutro Abs 9.3 (*)    All other components within normal limits  COMPREHENSIVE METABOLIC PANEL - Abnormal; Notable for the following:    CO2 21 (*)    Glucose, Bld 116 (*)    Total Protein 8.3 (*)    AST 54 (*)    Total Bilirubin 1.7 (*)    All other components within normal limits    Imaging Review Ct Abdomen Pelvis W Contrast  07/17/2015   CLINICAL DATA:  Initial evaluation for acute right lower quadrant pain with chills and vomiting. History of Crohn's disease.  EXAM: CT ABDOMEN AND PELVIS WITH CONTRAST  TECHNIQUE: Multidetector CT imaging of the abdomen and pelvis was performed using the standard protocol following bolus administration of intravenous contrast.  CONTRAST:  89mL OMNIPAQUE IOHEXOL 300 MG/ML SOLN, 132mL OMNIPAQUE IOHEXOL 300 MG/ML SOLN  COMPARISON:  Prior CT from 01/03/2015  FINDINGS: The visualized lung bases are clear.  Liver demonstrates a normal contrast enhanced appearance. Gallbladder is absent. No biliary dilatation. Spleen, adrenal glands, and pancreas demonstrate a normal contrast enhanced appearance.  Kidneys are equal size with symmetric enhancement. No nephrolithiasis, hydronephrosis, or focal enhancing renal mass.  Stomach within normal limits. Proximal small bowel within normal limits. There is mural thickening with mucosal enhancement about the terminal ileum, suggesting active Crohn's disease. Terminal ileum mildly prominent measuring up to 3 cm in diameter with intraluminal fluid density. There is adjacent free fluid within the pelvis. Fecalization of these small bowel proximally is suggesting slow transit. No evidence for overt bowel obstruction. Scattered subcentimeter pericecal lymph nodes, likely  reactive in nature. Distally, colon is decompressed without acute inflammatory changes. No evidence for acute appendicitis.  Bladder is decompressed. Mild circumferential bladder wall thickening likely related incomplete distension. Uterus and ovaries within normal limits.  No free air. No pathologically enlarged intra-abdominal or pelvic lymph nodes. Normal intravascular enhancement seen throughout the intra-abdominal aorta and its branch vessels.  No acute osseous abnormality. No worrisome lytic or blastic osseous lesions.  IMPRESSION: 1. Findings most consistent with active Crohn's disease involving the terminal ileum with associated free fluid. No abscess, fistula, or obstruction. 2. No other acute intra-abdominal or pelvic process.   Electronically Signed   By: Jeannine Boga M.D.   On: 07/17/2015 01:11     EKG Interpretation None      MDM   Final diagnoses:  Abdominal pain    24 yo F w/ crohn's flare with slow transit but without complication. Pain controlled, tolerating PO, stable for dc. Patient upset that she is being discharged 'without anything being done to fix it' I explained to her that this was an outpatient treatment and she should contact her GI doctor in AM and discuss a burst of immune modulator therapy, in the mean time I  would give a short course of narcotics to help alleviate symptoms until that could be done. She was not happy with this answer, but I explained that since her pain was better and she was eating that there was no reason for admission however if symptoms worsen or she was not able to take meds at home then she may need to come back for admission.     Merrily Pew, MD 07/17/15 [id number]

## 2015-07-16 NOTE — ED Notes (Signed)
Pt presents c/o LLQ and mid upper abdominal pain since yesterday, vomiting x "too many to count", denies diarrhea. Hx of crohn's.

## 2015-07-16 NOTE — ED Notes (Signed)
Pt is here with RLQ that started yesterday with chills and then started vomiting sometime yesterday.  Pt reports the pain comes and goes with intensity.  No urinary symptoms, reports constipation and the last day she thinks is Sunday. Pt has history of Crohn's.  No vaginal discharge or bleeding.  LMP:  Stopped on Friday

## 2015-07-17 MED ORDER — CIPROFLOXACIN HCL 500 MG PO TABS
ORAL_TABLET | ORAL | Status: AC
  Filled 2015-07-17: qty 1

## 2015-07-17 MED ORDER — HYDROMORPHONE HCL 1 MG/ML IJ SOLN
INTRAMUSCULAR | Status: AC
  Filled 2015-07-17: qty 1

## 2015-07-17 MED ORDER — OXYCODONE-ACETAMINOPHEN 5-325 MG PO TABS
ORAL_TABLET | ORAL | Status: AC
  Filled 2015-07-17: qty 2

## 2015-07-17 MED ORDER — METRONIDAZOLE 500 MG PO TABS
ORAL_TABLET | ORAL | Status: AC
  Filled 2015-07-17: qty 1

## 2015-08-16 ENCOUNTER — Other Ambulatory Visit (HOSPITAL_COMMUNITY): Payer: Self-pay | Admitting: *Deleted

## 2015-08-19 ENCOUNTER — Encounter (HOSPITAL_COMMUNITY): Payer: Managed Care, Other (non HMO)

## 2015-09-10 ENCOUNTER — Other Ambulatory Visit (HOSPITAL_COMMUNITY): Payer: Self-pay | Admitting: *Deleted

## 2015-09-11 ENCOUNTER — Encounter (HOSPITAL_COMMUNITY)
Admission: RE | Admit: 2015-09-11 | Discharge: 2015-09-11 | Disposition: A | Discharge status: Home / Self Care | Payer: Managed Care, Other (non HMO) | Source: Ambulatory Visit | Attending: Gastroenterology | Admitting: Gastroenterology

## 2015-09-11 DIAGNOSIS — K50919 Crohn's disease, unspecified, with unspecified complications: Secondary | ICD-10-CM | POA: Insufficient documentation

## 2015-09-11 LAB — CBC
HEMATOCRIT: 37.3 % (ref 36.0–46.0)
HEMOGLOBIN: 13.2 g/dL (ref 12.0–15.0)
MCH: 30.1 pg (ref 26.0–34.0)
MCHC: 35.4 g/dL (ref 30.0–36.0)
MCV: 85.2 fL (ref 78.0–100.0)
Platelets: 209 10*3/uL (ref 150–400)
RBC: 4.38 MIL/uL (ref 3.87–5.11)
RDW: 12.2 % (ref 11.5–15.5)
WBC: 9 10*3/uL (ref 4.0–10.5)

## 2015-09-11 LAB — C-REACTIVE PROTEIN: CRP: 1.1 mg/dL — AB (ref <1.0)

## 2015-09-11 LAB — AST: AST: 22 U/L (ref 15–41)

## 2015-09-11 LAB — ALT: ALT: 19 U/L (ref 14–54)

## 2015-09-11 MED ORDER — LORATADINE 10 MG PO TABS
ORAL_TABLET | ORAL | Status: AC
  Filled 2015-09-11: qty 1

## 2015-09-11 MED ORDER — SODIUM CHLORIDE 0.9 % IV SOLN
10.0000 mg/kg | INTRAVENOUS | Status: AC
  Administered 2015-09-11: 1000 mg via INTRAVENOUS
  Filled 2015-09-11: qty 100

## 2015-09-11 MED ORDER — LORATADINE 10 MG PO TABS
10.0000 mg | ORAL_TABLET | ORAL | Status: AC
  Administered 2015-09-11: 10 mg via ORAL

## 2015-09-11 MED ORDER — METHYLPREDNISOLONE SODIUM SUCC 125 MG IJ SOLR
20.0000 mg | INTRAMUSCULAR | Status: AC
  Administered 2015-09-11: 20 mg via INTRAVENOUS

## 2015-09-11 MED ORDER — SODIUM CHLORIDE 0.9 % IV SOLN
INTRAVENOUS | Status: DC
  Administered 2015-09-11: 12:00:00 via INTRAVENOUS

## 2015-09-11 MED ORDER — METHYLPREDNISOLONE SODIUM SUCC 40 MG IJ SOLR
INTRAMUSCULAR | Status: AC
  Filled 2015-09-11: qty 1

## 2015-09-12 LAB — MISC LABCORP TEST (SEND OUT): Labcorp test code: 503800

## 2015-09-30 LAB — MISC LABCORP TEST (SEND OUT): Labcorp test code: 503870

## 2015-11-05 ENCOUNTER — Other Ambulatory Visit (HOSPITAL_COMMUNITY): Payer: Self-pay | Admitting: *Deleted

## 2015-11-06 ENCOUNTER — Encounter (HOSPITAL_COMMUNITY)
Admission: RE | Admit: 2015-11-06 | Discharge: 2015-11-06 | Disposition: A | Discharge status: Home / Self Care | Payer: Managed Care, Other (non HMO) | Source: Ambulatory Visit | Attending: Gastroenterology | Admitting: Gastroenterology

## 2015-11-06 DIAGNOSIS — K50919 Crohn's disease, unspecified, with unspecified complications: Secondary | ICD-10-CM | POA: Diagnosis present

## 2015-11-06 LAB — CBC
HCT: 39.4 % (ref 36.0–46.0)
Hemoglobin: 13.9 g/dL (ref 12.0–15.0)
MCH: 29.9 pg (ref 26.0–34.0)
MCHC: 35.3 g/dL (ref 30.0–36.0)
MCV: 84.7 fL (ref 78.0–100.0)
PLATELETS: 187 10*3/uL (ref 150–400)
RBC: 4.65 MIL/uL (ref 3.87–5.11)
RDW: 12.3 % (ref 11.5–15.5)
WBC: 7.6 10*3/uL (ref 4.0–10.5)

## 2015-11-06 LAB — AST: AST: 34 U/L (ref 15–41)

## 2015-11-06 LAB — C-REACTIVE PROTEIN: CRP: <0.5 mg/dL (ref <1.0)

## 2015-11-06 LAB — ALT: ALT: 23 U/L (ref 14–54)

## 2015-11-06 MED ORDER — LORATADINE 10 MG PO TABS
ORAL_TABLET | ORAL | Status: AC
  Filled 2015-11-06: qty 1

## 2015-11-06 MED ORDER — SODIUM CHLORIDE 0.9 % IV SOLN
INTRAVENOUS | Status: DC
  Administered 2015-11-06: 09:00:00 via INTRAVENOUS

## 2015-11-06 MED ORDER — METHYLPREDNISOLONE SODIUM SUCC 40 MG IJ SOLR
INTRAMUSCULAR | Status: AC
  Administered 2015-11-06: 20 mg
  Filled 2015-11-06: qty 1

## 2015-11-06 MED ORDER — INFLIXIMAB 100 MG IV SOLR
10.0000 mg/kg | INTRAVENOUS | Status: DC
Start: 2015-11-06 — End: 2015-11-07
  Administered 2015-11-06: 900 mg via INTRAVENOUS
  Filled 2015-11-06: qty 90

## 2015-11-06 MED ORDER — LORATADINE 10 MG PO TABS
10.0000 mg | ORAL_TABLET | Freq: Every day | ORAL | Status: DC
  Administered 2015-11-06: 10 mg via ORAL

## 2015-11-06 MED ORDER — METHYLPREDNISOLONE SODIUM SUCC 125 MG IJ SOLR: 20.0000 mg | INTRAMUSCULAR | Status: DC

## 2016-01-01 ENCOUNTER — Encounter (HOSPITAL_COMMUNITY)
Admission: RE | Admit: 2016-01-01 | Discharge: 2016-01-01 | Disposition: A | Discharge status: Home / Self Care | Payer: Managed Care, Other (non HMO) | Source: Ambulatory Visit | Attending: Gastroenterology | Admitting: Gastroenterology

## 2016-01-01 DIAGNOSIS — K50919 Crohn's disease, unspecified, with unspecified complications: Secondary | ICD-10-CM | POA: Insufficient documentation

## 2016-01-01 LAB — CBC
HEMATOCRIT: 39.4 % (ref 36.0–46.0)
Hemoglobin: 13.6 g/dL (ref 12.0–15.0)
MCH: 29 pg (ref 26.0–34.0)
MCHC: 34.5 g/dL (ref 30.0–36.0)
MCV: 84 fL (ref 78.0–100.0)
PLATELETS: 242 10*3/uL (ref 150–400)
RBC: 4.69 MIL/uL (ref 3.87–5.11)
RDW: 12.6 % (ref 11.5–15.5)
WBC: 7 10*3/uL (ref 4.0–10.5)

## 2016-01-01 LAB — AST: AST: 23 U/L (ref 15–41)

## 2016-01-01 LAB — ALT: ALT: 24 U/L (ref 14–54)

## 2016-01-01 LAB — C-REACTIVE PROTEIN: CRP: <0.5 mg/dL (ref <1.0)

## 2016-01-01 MED ORDER — METHYLPREDNISOLONE SODIUM SUCC 125 MG IJ SOLR: 20.0000 mg | INTRAMUSCULAR | Status: DC

## 2016-01-01 MED ORDER — LORATADINE 10 MG PO TABS
ORAL_TABLET | ORAL | Status: AC
  Administered 2016-01-01: 10 mg
  Filled 2016-01-01: qty 1

## 2016-01-01 MED ORDER — SODIUM CHLORIDE 0.9 % IV SOLN
10.0000 mg/kg | INTRAVENOUS | Status: DC
  Administered 2016-01-01: 900 mg via INTRAVENOUS
  Filled 2016-01-01: qty 90

## 2016-01-01 MED ORDER — METHYLPREDNISOLONE SODIUM SUCC 40 MG IJ SOLR
INTRAMUSCULAR | Status: AC
  Administered 2016-01-01: 20 mg
  Filled 2016-01-01: qty 1

## 2016-01-01 MED ORDER — SODIUM CHLORIDE 0.9 % IV SOLN
INTRAVENOUS | Status: DC
  Administered 2016-01-01: 09:00:00 via INTRAVENOUS

## 2016-01-01 MED ORDER — LORATADINE 10 MG PO TABS: 10.0000 mg | ORAL_TABLET | Freq: Every day | ORAL | Status: DC

## 2016-02-25 ENCOUNTER — Other Ambulatory Visit (HOSPITAL_COMMUNITY): Payer: Self-pay | Admitting: *Deleted

## 2016-02-26 ENCOUNTER — Ambulatory Visit (HOSPITAL_COMMUNITY)
Admission: RE | Admit: 2016-02-26 | Discharge: 2016-02-26 | Disposition: A | Discharge status: Home / Self Care | Payer: Managed Care, Other (non HMO) | Source: Ambulatory Visit | Attending: Student | Admitting: Student

## 2016-02-26 DIAGNOSIS — K50919 Crohn's disease, unspecified, with unspecified complications: Secondary | ICD-10-CM | POA: Diagnosis not present

## 2016-02-26 LAB — CBC
HEMATOCRIT: 36.1 % (ref 36.0–46.0)
HEMOGLOBIN: 12.5 g/dL (ref 12.0–15.0)
MCH: 28.7 pg (ref 26.0–34.0)
MCHC: 34.6 g/dL (ref 30.0–36.0)
MCV: 82.8 fL (ref 78.0–100.0)
Platelets: 244 10*3/uL (ref 150–400)
RBC: 4.36 MIL/uL (ref 3.87–5.11)
RDW: 12.7 % (ref 11.5–15.5)
WBC: 8.7 10*3/uL (ref 4.0–10.5)

## 2016-02-26 LAB — ALT: ALT: 42 U/L (ref 14–54)

## 2016-02-26 LAB — AST: AST: 27 U/L (ref 15–41)

## 2016-02-26 MED ORDER — SODIUM CHLORIDE 0.9 % IV SOLN
10.0000 mg/kg | INTRAVENOUS | Status: DC
  Administered 2016-02-26: 900 mg via INTRAVENOUS
  Filled 2016-02-26: qty 90

## 2016-02-26 MED ORDER — SODIUM CHLORIDE 0.9 % IV SOLN
INTRAVENOUS | Status: DC
  Administered 2016-02-26: 08:00:00 via INTRAVENOUS

## 2016-02-26 MED ORDER — METHYLPREDNISOLONE SODIUM SUCC 40 MG IJ SOLR
20.0000 mg | INTRAMUSCULAR | Status: DC
  Administered 2016-02-26: 20 mg via INTRAVENOUS

## 2016-02-26 MED ORDER — LORATADINE 10 MG PO TABS
ORAL_TABLET | ORAL | Status: AC
  Filled 2016-02-26: qty 1

## 2016-02-26 MED ORDER — METHYLPREDNISOLONE SODIUM SUCC 40 MG IJ SOLR
INTRAMUSCULAR | Status: AC
  Filled 2016-02-26: qty 1

## 2016-02-26 MED ORDER — LORATADINE 10 MG PO TABS
10.0000 mg | ORAL_TABLET | Freq: Every day | ORAL | Status: DC
  Administered 2016-02-26: 10 mg via ORAL

## 2016-04-22 ENCOUNTER — Encounter (HOSPITAL_COMMUNITY): Payer: Managed Care, Other (non HMO)

## 2016-05-11 ENCOUNTER — Other Ambulatory Visit (HOSPITAL_COMMUNITY): Payer: Self-pay | Admitting: *Deleted

## 2016-05-12 ENCOUNTER — Encounter (HOSPITAL_COMMUNITY)
Admission: RE | Admit: 2016-05-12 | Discharge: 2016-05-12 | Disposition: A | Discharge status: Home / Self Care | Payer: 59 | Source: Ambulatory Visit | Attending: Student | Admitting: Student

## 2016-05-12 DIAGNOSIS — K5090 Crohn's disease, unspecified, without complications: Secondary | ICD-10-CM | POA: Insufficient documentation

## 2016-05-12 LAB — AST: AST: 23 U/L (ref 15–41)

## 2016-05-12 LAB — CBC
HEMATOCRIT: 37.7 % (ref 36.0–46.0)
HEMOGLOBIN: 12.7 g/dL (ref 12.0–15.0)
MCH: 27.7 pg (ref 26.0–34.0)
MCHC: 33.7 g/dL (ref 30.0–36.0)
MCV: 82.3 fL (ref 78.0–100.0)
Platelets: 241 10*3/uL (ref 150–400)
RBC: 4.58 MIL/uL (ref 3.87–5.11)
RDW: 12.4 % (ref 11.5–15.5)
WBC: 9.1 10*3/uL (ref 4.0–10.5)

## 2016-05-12 LAB — ALT: ALT: 32 U/L (ref 14–54)

## 2016-05-12 LAB — C-REACTIVE PROTEIN

## 2016-05-12 MED ORDER — METHYLPREDNISOLONE SODIUM SUCC 40 MG IJ SOLR
INTRAMUSCULAR | Status: AC
  Filled 2016-05-12: qty 1

## 2016-05-12 MED ORDER — LORATADINE 10 MG PO TABS
ORAL_TABLET | ORAL | Status: AC
Start: 2016-05-12 — End: 2016-05-12
  Filled 2016-05-12: qty 1

## 2016-05-12 MED ORDER — SODIUM CHLORIDE 0.9 % IV SOLN
10.0000 mg/kg | INTRAVENOUS | Status: DC
  Administered 2016-05-12: 1000 mg via INTRAVENOUS
  Filled 2016-05-12: qty 100

## 2016-05-12 MED ORDER — METHYLPREDNISOLONE SODIUM SUCC 40 MG IJ SOLR
20.0000 mg | INTRAMUSCULAR | Status: DC
  Administered 2016-05-12: 20 mg via INTRAVENOUS

## 2016-05-12 MED ORDER — SODIUM CHLORIDE 0.9 % IV SOLN
INTRAVENOUS | Status: DC
Start: 2016-05-12 — End: 2016-05-13
  Administered 2016-05-12: 08:00:00 via INTRAVENOUS

## 2016-05-12 MED ORDER — LORATADINE 10 MG PO TABS
10.0000 mg | ORAL_TABLET | Freq: Every day | ORAL | Status: DC
  Administered 2016-05-12: 10 mg via ORAL

## 2016-06-30 ENCOUNTER — Encounter (HOSPITAL_COMMUNITY)
Admission: RE | Admit: 2016-06-30 | Discharge: 2016-06-30 | Disposition: A | Discharge status: Home / Self Care | Payer: 59 | Source: Ambulatory Visit | Attending: Student | Admitting: Student

## 2016-06-30 DIAGNOSIS — K5090 Crohn's disease, unspecified, without complications: Secondary | ICD-10-CM | POA: Diagnosis not present

## 2016-06-30 LAB — CBC
HCT: 37.8 % (ref 36.0–46.0)
Hemoglobin: 13.1 g/dL (ref 12.0–15.0)
MCH: 29 pg (ref 26.0–34.0)
MCHC: 34.7 g/dL (ref 30.0–36.0)
MCV: 83.6 fL (ref 78.0–100.0)
Platelets: 240 10*3/uL (ref 150–400)
RBC: 4.52 MIL/uL (ref 3.87–5.11)
RDW: 12.6 % (ref 11.5–15.5)
WBC: 7.6 10*3/uL (ref 4.0–10.5)

## 2016-06-30 LAB — ALT: ALT: 39 U/L (ref 14–54)

## 2016-06-30 LAB — AST: AST: 29 U/L (ref 15–41)

## 2016-06-30 LAB — C-REACTIVE PROTEIN: CRP: <0.5 mg/dL (ref <1.0)

## 2016-06-30 MED ORDER — METHYLPREDNISOLONE SODIUM SUCC 40 MG IJ SOLR
20.0000 mg | INTRAMUSCULAR | Status: DC
  Administered 2016-06-30: 20 mg via INTRAVENOUS

## 2016-06-30 MED ORDER — SODIUM CHLORIDE 0.9 % IV SOLN
10.0000 mg/kg | INTRAVENOUS | Status: DC
  Administered 2016-06-30: 1000 mg via INTRAVENOUS
  Filled 2016-06-30: qty 100

## 2016-06-30 MED ORDER — SODIUM CHLORIDE 0.9 % IV SOLN
INTRAVENOUS | Status: DC
Start: 2016-06-30 — End: 2016-07-01
  Administered 2016-06-30: 09:00:00 via INTRAVENOUS

## 2016-06-30 MED ORDER — LORATADINE 10 MG PO TABS
10.0000 mg | ORAL_TABLET | Freq: Every day | ORAL | Status: DC
  Administered 2016-06-30: 10 mg via ORAL

## 2016-06-30 MED ORDER — SODIUM CHLORIDE 0.9 % IV SOLN
10.0000 mg/kg | INTRAVENOUS | Status: DC
  Filled 2016-06-30: qty 100

## 2016-06-30 MED ORDER — LORATADINE 10 MG PO TABS
ORAL_TABLET | ORAL | Status: AC
  Filled 2016-06-30: qty 1

## 2016-06-30 MED ORDER — METHYLPREDNISOLONE SODIUM SUCC 40 MG IJ SOLR
INTRAMUSCULAR | Status: AC
  Filled 2016-06-30: qty 1

## 2016-07-02 ENCOUNTER — Encounter (HOSPITAL_COMMUNITY): Payer: Managed Care, Other (non HMO)

## 2016-07-07 ENCOUNTER — Ambulatory Visit: Payer: Self-pay

## 2016-07-07 ENCOUNTER — Other Ambulatory Visit: Payer: Self-pay | Admitting: Occupational Medicine

## 2016-07-07 ENCOUNTER — Encounter (HOSPITAL_COMMUNITY): Payer: Managed Care, Other (non HMO)

## 2016-07-07 DIAGNOSIS — Z0000 Encounter for general adult medical examination without abnormal findings: Secondary | ICD-10-CM

## 2016-10-20 ENCOUNTER — Emergency Department (HOSPITAL_COMMUNITY): Payer: Managed Care, Other (non HMO)

## 2016-10-20 ENCOUNTER — Emergency Department (HOSPITAL_COMMUNITY)
Admission: EM | Admit: 2016-10-20 | Discharge: 2016-10-20 | Disposition: A | Discharge status: Home / Self Care | Payer: Managed Care, Other (non HMO) | Attending: Emergency Medicine | Admitting: Emergency Medicine

## 2016-10-20 ENCOUNTER — Encounter (HOSPITAL_COMMUNITY): Payer: Self-pay

## 2016-10-20 DIAGNOSIS — R1031 Right lower quadrant pain: Secondary | ICD-10-CM | POA: Diagnosis present

## 2016-10-20 DIAGNOSIS — K50919 Crohn's disease, unspecified, with unspecified complications: Secondary | ICD-10-CM | POA: Insufficient documentation

## 2016-10-20 DIAGNOSIS — R1032 Left lower quadrant pain: Secondary | ICD-10-CM | POA: Diagnosis not present

## 2016-10-20 DIAGNOSIS — R1030 Lower abdominal pain, unspecified: Secondary | ICD-10-CM

## 2016-10-20 LAB — COMPREHENSIVE METABOLIC PANEL
ALT: 61 U/L — AB (ref 14–54)
AST: 43 U/L — AB (ref 15–41)
Albumin: 3.9 g/dL (ref 3.5–5.0)
Alkaline Phosphatase: 79 U/L (ref 38–126)
Anion gap: 11 (ref 5–15)
BILIRUBIN TOTAL: 0.4 mg/dL (ref 0.3–1.2)
CALCIUM: 9.3 mg/dL (ref 8.9–10.3)
CO2: 23 mmol/L (ref 22–32)
CREATININE: 0.74 mg/dL (ref 0.44–1.00)
Chloride: 104 mmol/L (ref 101–111)
GFR calc Af Amer: >60 mL/min (ref >60)
Glucose, Bld: 100 mg/dL — ABNORMAL HIGH (ref 65–99)
Potassium: 3.4 mmol/L — ABNORMAL LOW (ref 3.5–5.1)
Sodium: 138 mmol/L (ref 135–145)
TOTAL PROTEIN: 7.9 g/dL (ref 6.5–8.1)

## 2016-10-20 LAB — URINALYSIS, ROUTINE W REFLEX MICROSCOPIC
Bilirubin Urine: NEGATIVE
GLUCOSE, UA: NEGATIVE mg/dL
Hgb urine dipstick: NEGATIVE
KETONES UR: NEGATIVE mg/dL
LEUKOCYTES UA: NEGATIVE
NITRITE: NEGATIVE
PROTEIN: NEGATIVE mg/dL
Specific Gravity, Urine: 1.015 (ref 1.005–1.030)
pH: 7 (ref 5.0–8.0)

## 2016-10-20 LAB — CBC
HCT: 38.8 % (ref 36.0–46.0)
Hemoglobin: 13.3 g/dL (ref 12.0–15.0)
MCH: 28.6 pg (ref 26.0–34.0)
MCHC: 34.3 g/dL (ref 30.0–36.0)
MCV: 83.4 fL (ref 78.0–100.0)
PLATELETS: 275 10*3/uL (ref 150–400)
RBC: 4.65 MIL/uL (ref 3.87–5.11)
RDW: 12 % (ref 11.5–15.5)
WBC: 8.4 10*3/uL (ref 4.0–10.5)

## 2016-10-20 LAB — I-STAT BETA HCG BLOOD, ED (MC, WL, AP ONLY): I-stat hCG, quantitative: <5 m[IU]/mL (ref <5)

## 2016-10-20 LAB — LIPASE, BLOOD: Lipase: 29 U/L (ref 11–51)

## 2016-10-20 MED ORDER — OXYCODONE-ACETAMINOPHEN 5-325 MG PO TABS: 2.0000 | ORAL_TABLET | Freq: Four times a day (QID) | ORAL | 0 refills | Status: DC | PRN

## 2016-10-20 MED ORDER — SODIUM CHLORIDE 0.9 % IV BOLUS (SEPSIS)
1000.0000 mL | Freq: Once | INTRAVENOUS | Status: AC
  Administered 2016-10-20: 1000 mL via INTRAVENOUS

## 2016-10-20 MED ORDER — IOPAMIDOL (ISOVUE-300) INJECTION 61%
INTRAVENOUS | Status: AC
  Administered 2016-10-20: 100 mL
  Filled 2016-10-20: qty 100

## 2016-10-20 MED ORDER — MORPHINE SULFATE (PF) 4 MG/ML IV SOLN
4.0000 mg | Freq: Once | INTRAVENOUS | Status: AC
  Administered 2016-10-20: 4 mg via INTRAVENOUS
  Filled 2016-10-20: qty 1

## 2016-10-20 MED ORDER — DOCUSATE SODIUM 100 MG PO CAPS: 100.0000 mg | ORAL_CAPSULE | Freq: Every day | ORAL | 0 refills | Status: DC

## 2016-10-20 MED ORDER — ONDANSETRON HCL 4 MG PO TABS: 4.0000 mg | ORAL_TABLET | Freq: Three times a day (TID) | ORAL | 0 refills | Status: DC | PRN

## 2016-10-20 MED ORDER — ONDANSETRON HCL 4 MG/2ML IJ SOLN
4.0000 mg | Freq: Once | INTRAMUSCULAR | Status: AC
  Administered 2016-10-20: 4 mg via INTRAVENOUS
  Filled 2016-10-20: qty 2

## 2016-10-20 NOTE — ED Triage Notes (Signed)
Pt reports abd pain X2 days. She also reports severe nausea. Pt reports she has hx of similar symptoms with SBO.

## 2016-10-20 NOTE — ED Provider Notes (Signed)
[facility] DEPT Provider Note   CSN: [id number] Arrival date & time: 10/20/16  1437     History   Chief Complaint Chief Complaint  Patient presents with  . Abdominal Pain    HPI Sarah Morrison is a 25 y.o. female.  Patient presents with lower abdominal pain, nausea/vomiting, and constipation since Thursday. Has a history of Crohn's disease. Denies recent travel or sick contacts. Denies hematemesis or rectal bleeding. Reports she is passing gas today.   The history is provided by the patient. No language interpreter was used.  Abdominal Pain   This is a new problem. The current episode started more than 2 days ago. The problem occurs hourly. The problem has not changed since onset.The pain is associated with eating. The pain is located in the RLQ, suprapubic region and LLQ. The quality of the pain is cramping and sharp. The pain is at a severity of 6/10. The pain is moderate. Associated symptoms include flatus, nausea, vomiting and constipation. Pertinent negatives include fever, diarrhea, melena and dysuria. The symptoms are aggravated by eating. Nothing relieves the symptoms. Past workup includes GI consult and CT scan. Past workup does not include surgery. Her past medical history is significant for Crohn's disease.    Past Medical History:  Diagnosis Date  . Dermoid cyst of ovary, 6.1cm by CT [id number] 10/11/2012  . Gastritis   . Ileitis, probable Crohn's disease 10/11/2012   CT 10/11/2012: Marked inflammatory findings in the terminal ileum and adjacent  cecum, with enlarged pericecal lymph nodes. The appearance favors  Crohn's disease/terminal ileitis, with infectious enterocolitis  less likely given the segmental involvement. No definite  extraluminal gas although there is mild complex ascites in the  pelvis.      Patient Active Problem List   Diagnosis Date Noted  . Ileitis, probable Crohn's disease 10/11/2012  . Reflux 10/11/2012  . 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 COLONOSCOPY  10/12/2012   Procedure: COLONOSCOPY;  Surgeon: Inda Castle, MD;  Location: [facility];  Service: Endoscopy;  Laterality: N/A;  . ESOPHAGOGASTRODUODENOSCOPY    . 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 LAPAROTOMY N/A 06/16/2013   Procedure: EXPLORATORY LAPAROTOMY With Removal of Left Ovarian Dermoid Cyst;  Surgeon: Thurnell Lose, MD;  Location: [facility];  Service: Gynecology;  Laterality: N/A;    OB History    Gravida Para Term Preterm AB Living   0             SAB TAB Ectopic Multiple Live Births                   Home Medications    Prior to Admission medications   Medication Sig Start Date End Date Taking? Authorizing Provider  docusate sodium (COLACE) 100 MG capsule Take 1 capsule (100 mg total) by mouth daily. 10/20/16   Harlin Heys, MD  ondansetron (ZOFRAN) 4 MG tablet Take 1 tablet (4 mg total) by mouth every 8 (eight) hours as needed for nausea or vomiting. 10/20/16   Harlin Heys, MD  oxyCODONE-acetaminophen (PERCOCET/ROXICET) 5-325 MG tablet Take 2 tablets by mouth every 6 (six) hours as needed for severe pain. 10/20/16   Harlin Heys, MD    Family History Family History  Problem Relation Age of Onset  . Adopted: Yes  . Colon cancer Neg Hx     Social History Social History  Substance Use Topics  . Smoking status: Never Smoker  . Smokeless tobacco: Never Used  . Alcohol use No     Allergies   Patient has no known allergies.   Review of Systems Review of Systems  Constitutional: Negative for fever.  HENT: Negative.   Respiratory: Negative.   Cardiovascular: Negative.   Gastrointestinal: Positive for abdominal pain, constipation, flatus, nausea and vomiting. Negative for blood in stool, diarrhea and melena.  Genitourinary: Negative for dysuria.  Musculoskeletal: Negative.   Skin: Negative.     Allergic/Immunologic: Negative for immunocompromised state.  Neurological: Negative.   Hematological: Does not bruise/bleed easily.  Psychiatric/Behavioral: Negative.      Physical Exam Updated Vital Signs BP 116/80   Pulse 91   Temp 97.6 F (36.4 C) (Oral)   Resp 18   LMP 09/24/2016 (Exact Date)   SpO2 100%   Physical Exam  Constitutional: She is oriented to person, place, and time. She appears well-developed and well-nourished. No distress.  HENT:  Head: Normocephalic and atraumatic.  Eyes: Conjunctivae and EOM are normal.  Neck: Normal range of motion. Neck supple.  Cardiovascular: Normal rate, regular rhythm and normal heart sounds.  Exam reveals no gallop and no friction rub.   No murmur heard. Pulmonary/Chest: Effort normal and breath sounds normal. No respiratory distress. She has no wheezes. She has no rales.  Abdominal: Soft. She exhibits no distension and no mass. Bowel sounds are decreased. There is tenderness in the right lower quadrant, suprapubic area and left lower quadrant. There is no rigidity, no rebound and no guarding.  Neurological: She is alert and oriented to person, place, and time.  Skin: Skin is warm and dry. She is not diaphoretic.  Psychiatric: She has a normal mood and affect. Her behavior is normal. Judgment and thought content normal.     ED Treatments / Results  Labs (all labs ordered are listed, but only abnormal results are displayed) Labs Reviewed  COMPREHENSIVE METABOLIC PANEL - Abnormal; Notable for the following:       Result Value   Potassium 3.4 (*)    Glucose, Bld 100 (*)    BUN <5 (*)    AST 43 (*)    ALT 61 (*)    All other components within normal limits  URINALYSIS, ROUTINE W REFLEX MICROSCOPIC (NOT AT [facility]) - Abnormal; Notable for the following:    APPearance CLOUDY (*)    All other components within normal limits  LIPASE, BLOOD  CBC  I-STAT BETA HCG BLOOD, ED ([facility], [facility], [facility] ONLY)    EKG  EKG Interpretation None        Radiology Ct Abdomen Pelvis W Contrast  Result Date: 10/20/2016 CLINICAL DATA:  Lower abdominal pain and nausea for 5-6 days. History of cholecystectomy and gastritis, LEFT ovarian dermoid cyst removal, probable Crohn's disease. EXAM: CT ABDOMEN AND PELVIS WITH CONTRAST TECHNIQUE: Multidetector CT imaging of the abdomen and pelvis was performed using the standard protocol following bolus administration of intravenous contrast. CONTRAST:  100 cc ISOVUE-300 IOPAMIDOL (ISOVUE-300) INJECTION 61% COMPARISON:  Acute abdominal series October 20, 2016 at 1545 hours and CT abdomen and pelvis July 16, 2015 FINDINGS: LOWER CHEST: Lung bases are clear. Included heart size is normal. No pericardial effusion. HEPATOBILIARY: Liver is normal.  Status post cholecystectomy. PANCREAS: Normal. SPLEEN: Normal. ADRENALS/URINARY TRACT: Kidneys are orthotopic, demonstrating symmetric enhancement. No nephrolithiasis, hydronephrosis or solid renal masses. The unopacified ureters are normal in course and caliber. Urinary bladder is partially distended and unremarkable. Normal  adrenal glands. STOMACH/BOWEL: Circumferential terminal ileal wall thickening and hyper re- media, mild fat stranding. Prominent cecal neural fat consistent with chronic inflammation. The stomach, large bowel are normal in course and caliber without inflammatory changes. The appendix is not discretely identified, however there are no inflammatory changes in the right lower quadrant. VASCULAR/LYMPHATIC: Aortoiliac vessels are normal in course and caliber. Sub cm RIGHT lower quadrant lymph nodes without lymphadenopathy by CT size criteria. REPRODUCTIVE: Normal. OTHER: Small amount of low-density free fluid in RIGHT pelvis is likely reactive. MUSCULOSKELETAL: Nonacute. Small fat containing umbilical hernia. Anterior pelvic wall scarring. IMPRESSION: Terminal ileitis compatible with recurrent active Crohn's disease. No complication. Electronically Signed   By:  Elon Alas M.D.   On: 10/20/2016 20:11   Dg Abdomen Acute W/chest  Result Date: 10/20/2016 CLINICAL DATA:  Hypogastric and umbilical pain, constipation, nausea and vomiting x5 days. Hx of Crohn's disease, gastritis. EXAM: DG ABDOMEN ACUTE W/ 1V CHEST COMPARISON:  07/07/2016 FINDINGS: Heart size and mediastinal contours are within normal limits. Lungs are clear. No effusion. No free air. Normal bowel gas pattern.  Cholecystectomy clips. There are no abnormal calcifications. Regional bones unremarkable. IMPRESSION: No acute cardiopulmonary disease. Negative abdominal radiographs. Electronically Signed   By: Lucrezia Europe M.D.   On: 10/20/2016 16:14    Procedures Procedures (including critical care time)  Medications Ordered in ED Medications  sodium chloride 0.9 % bolus 1,000 mL (0 mLs Intravenous Stopped 10/20/16 2155)  ondansetron (ZOFRAN) injection 4 mg (4 mg Intravenous Given 10/20/16 1838)  morphine 4 MG/ML injection 4 mg (4 mg Intravenous Given 10/20/16 1838)  iopamidol (ISOVUE-300) 61 % injection (100 mLs  Contrast Given 10/20/16 1949)     Initial Impression / Assessment and Plan / ED Course  I have reviewed the triage vital signs and the nursing notes.  Pertinent labs & imaging results that were available during my care of the patient were reviewed by me and considered in my medical decision making (see chart for details).  Clinical Course     Patient presents with several days of abdominal pain, nausea/vomiting, and constipation with history of Crohn's disease. She takes remicade though does note missing a dose of it. She is overall well-appearing and ambulatory with normal vital signs, afebrile. Labs reveal negative pregnancy test, no signs of UTI/pyelonephritis. Exam reveals right lower quadrant tenderness with present bowel sounds. Do not suspect cholecystitis or pancreatitis based on location of her pain. CT scan obtained given her history of Crohn's to evaluate for  stricture, obstruction, or abscess. This revealed no obstruction or abscess though did note active terminal ileitis consistent with Crohn's disease. She had improved symptoms after pain and nausea control and was able to tolerate PO. Had a shared decision-making conversation with patient regarding her disposition. She is tolerating PO and feels overall well, and has good follow-up with her Gi. She will call tomorrow for an appointment and was given strict return precautions for worsening symptoms. She prefers discharge home and I feel this is reasonable given that she is reliable and has good follow-up. She is in good condition for discharge home.  Final Clinical Impressions(s) / ED Diagnoses   Final diagnoses:  Crohn's disease with complication, unspecified gastrointestinal tract location [facility])  Lower abdominal pain    New Prescriptions Discharge Medication List as of 10/20/2016  9:38 PM    START taking these medications   Details  docusate sodium (COLACE) 100 MG capsule Take 1 capsule (100 mg total) by mouth daily., Starting Tue  10/20/2016, Print    ondansetron (ZOFRAN) 4 MG tablet Take 1 tablet (4 mg total) by mouth every 8 (eight) hours as needed for nausea or vomiting., Starting Tue 10/20/2016, Print    oxyCODONE-acetaminophen (PERCOCET/ROXICET) 5-325 MG tablet Take 2 tablets by mouth every 6 (six) hours as needed for severe pain., Starting Tue 10/20/2016, Print         Harlin Heys, MD 10/20/16 [place], MD 10/23/16 1750

## 2016-10-20 NOTE — Discharge Instructions (Signed)
Your CAT scan showed no blockages but you do have signs of an active Crohn's flare. Call your gastroenterologist in the morning to schedule an appointment. Come back sooner if you can't keep liquids down, have uncontrolled pain, vomiting blood, or any other concerns.

## 2016-10-20 NOTE — ED Notes (Signed)
Pt to CT at this time.

## 2017-01-22 IMAGING — CT CT ENTEROGRAPHY (ABD-PELV W/ CM)
2 of 5 series · 11 of 36 positions shown, 18 images · IV contrast ([ID] VOLUMEN & [ID] OMNI 300)
Comparison: 06/28/2014

CLINICAL DATA: Right lower quadrant abdominal pain for 4 weeks.
History of Crohn's disease.

EXAM:
CT ABDOMEN AND PELVIS WITH CONTRAST (ENTEROGRAPHY)
TECHNIQUE: Multidetector CT of the abdomen and pelvis during bolus
administration of intravenous contrast. Negative oral contrast
VoLumen was given.
CONTRAST:  125mL OMNIPAQUE IOHEXOL 300 MG/ML  SOLN

[Series 3: enterography (id) · axial · 0.74mm/px · z∈[-401,-41]mm · 10 of 174 slices shown, 16 images]
[im 15/174  soft-tissue]
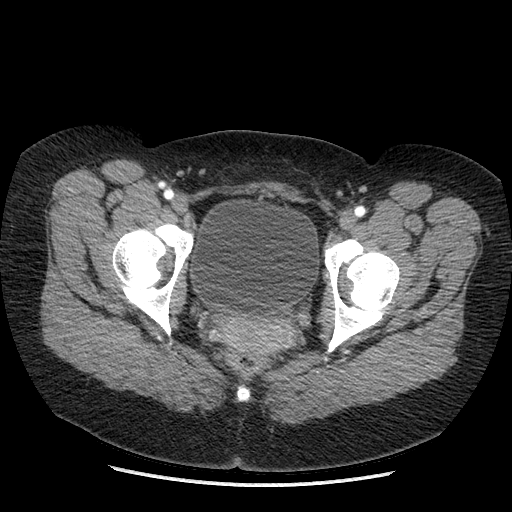
[im 15/174  bone]
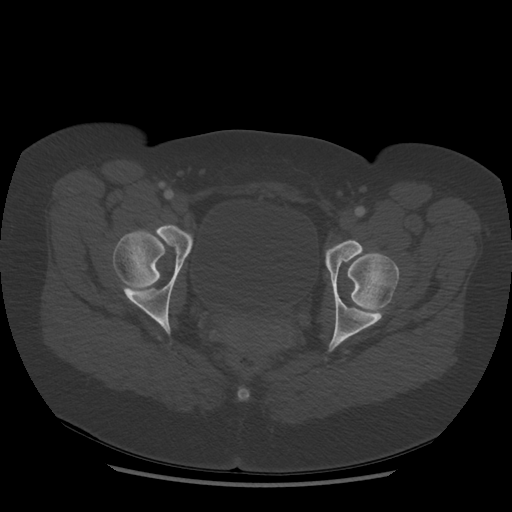
[im 29/174  soft-tissue]
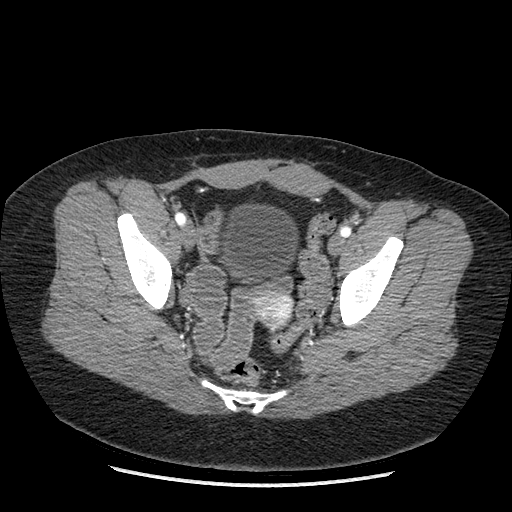
[im 44/174  soft-tissue]
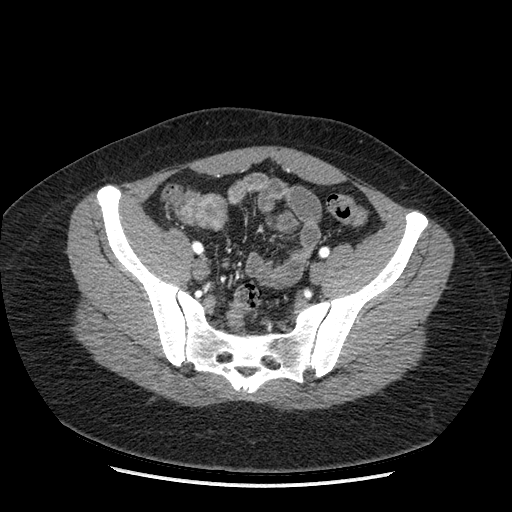
[im 58/174  soft-tissue]
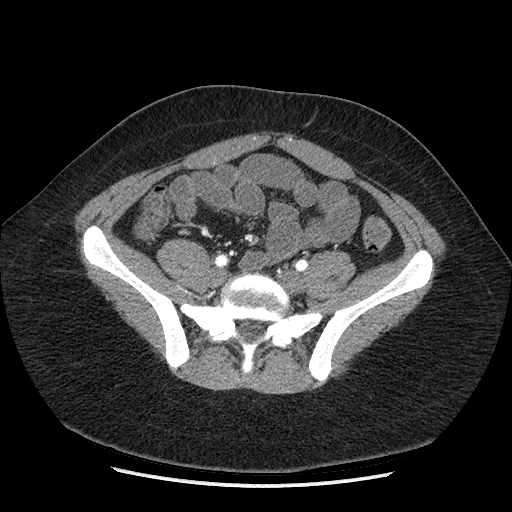
[im 73/174  soft-tissue]
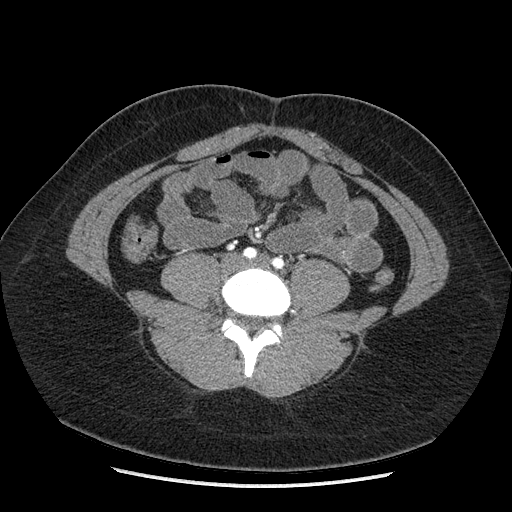
[im 101/174  soft-tissue]
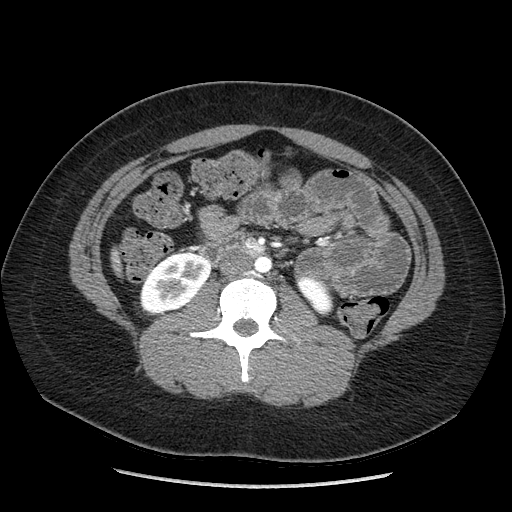
[im 116/174  soft-tissue]
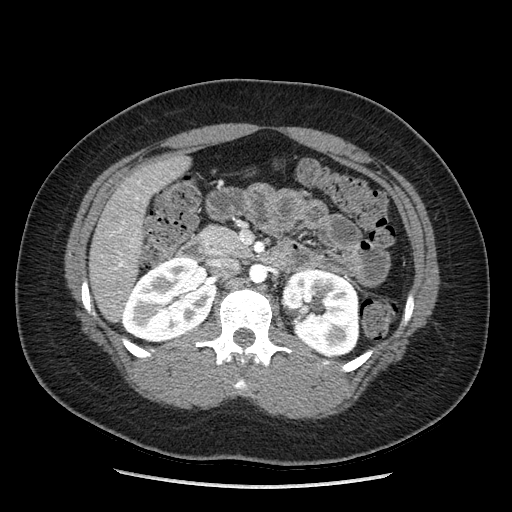
[im 116/174  lung]
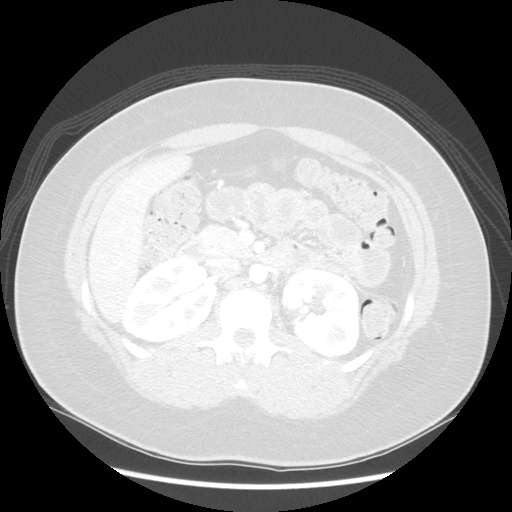
[im 130/174  soft-tissue]
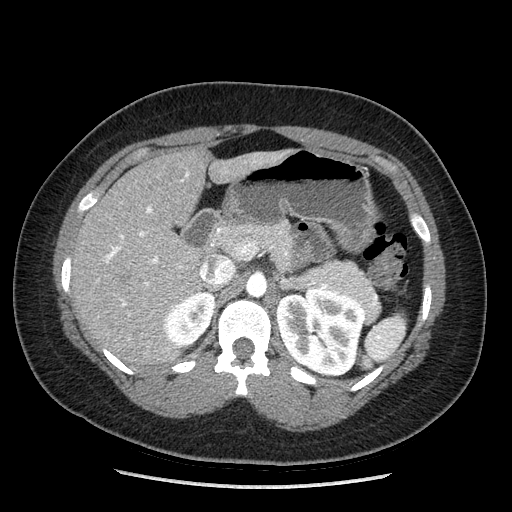
[im 130/174  lung]
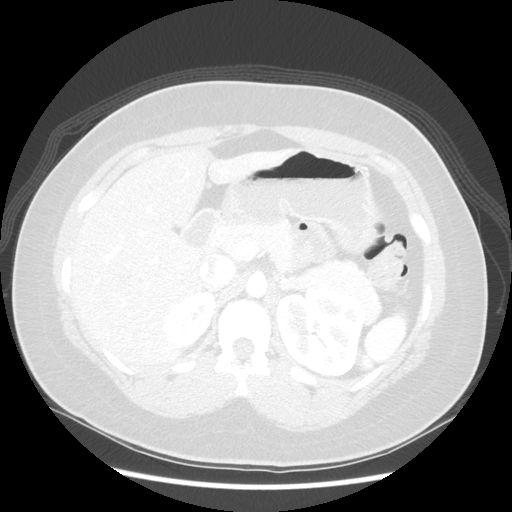
[im 145/174  soft-tissue]
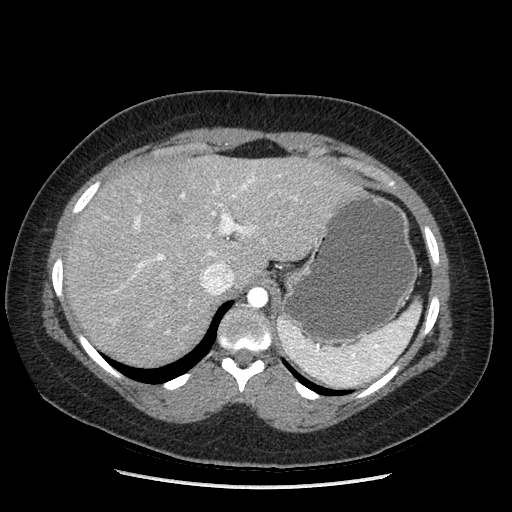
[im 145/174  lung]
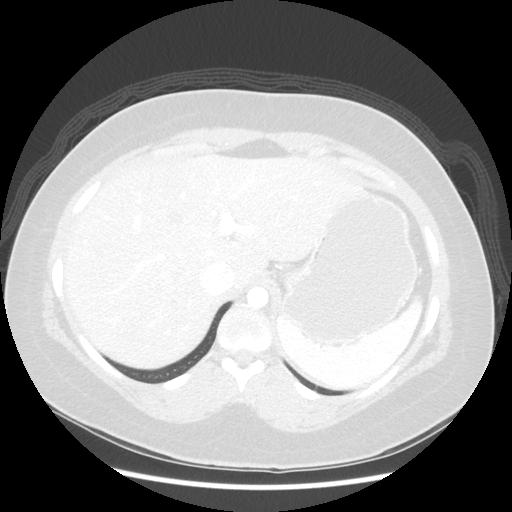
[im 145/174  bone]
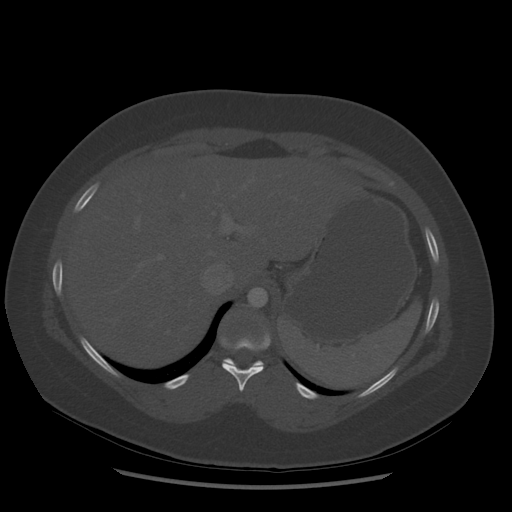
[im 159/174  soft-tissue]
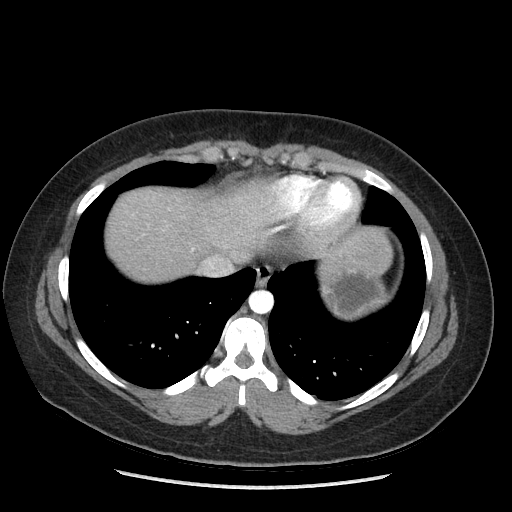
[im 159/174  lung]
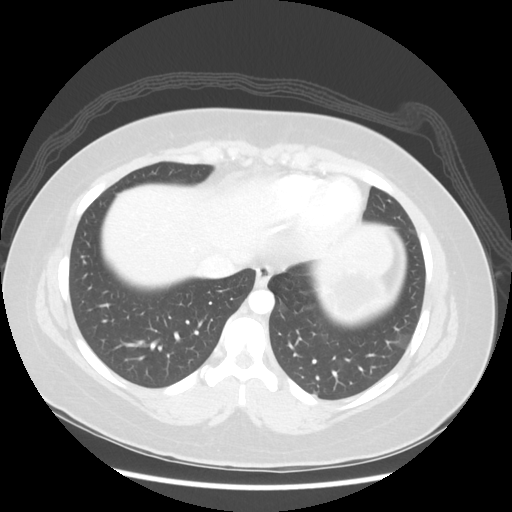

[Series 601: coronal body · coronal · 0.96mm/px · 1 of 126 slices shown, 2 images]
[im 42/126  soft-tissue]
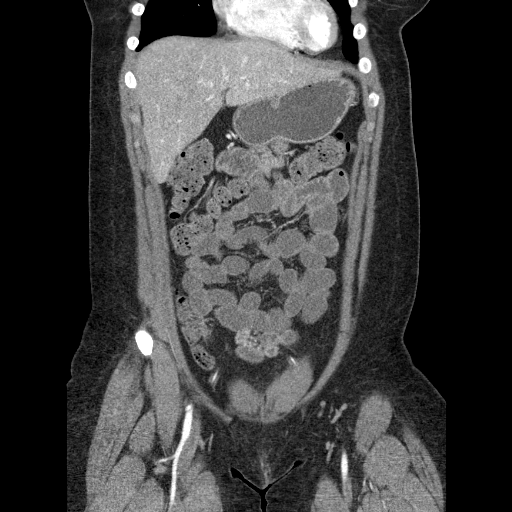
[im 42/126  bone]
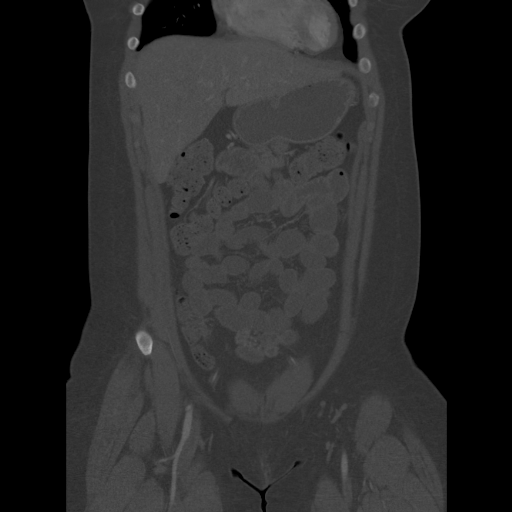

[11 of 36 positions shown; findings below may reference images not displayed]

FINDINGS: Lower chest: The lung bases are clear of acute process. No pleural
effusion or pulmonary lesions. The heart is normal in size. No
pericardial effusion. The distal esophagus and aorta are
unremarkable.

Hepatobiliary: Diffuse fatty infiltration of the liver. No focal
hepatic lesions or intrahepatic biliary dilatation. The gallbladder
is surgically absent. No common bile duct dilatation.

Pancreas: Normal

Spleen: Normal

Adrenals/Urinary Tract: Normal

Stomach/Bowel: The stomach, duodenum and proximal bowel are normal.
There is mural thickening and mucosal enhancement involving the
terminal ileum and surrounding the ileocecal valve consistent with
active Crohn's disease. There are also adjacent and filling/
hyperplastic pericecal lymph nodes. No obstruction for fistula. The
colon is unremarkable. There is moderate stool throughout the colon
and scattered diverticulosis.

Vascular/Lymphatic: No mesenteric or retroperitoneal mass or
adenopathy. The aorta and branch vessels are normal. The major
venous structures are normal.

Reproductive: The uterus and ovaries are unremarkable. The bladder
is normal. No pelvic mass, adenopathy or free pelvic fluid
collections. No inguinal mass or adenopathy.

Other: No abdominal wall hernia or subcutaneous lesions.

Musculoskeletal: No significant bony findings.
IMPRESSION: CT findings consistent with active Crohn's disease involving the
terminal ileum. No complicating features such as abscess, fistula or
obstruction. Reactive pericecal lymph nodes are noted.

No other significant abdominal/ pelvic findings. There is diffuse
fatty infiltration of the liver noted.

## 2017-03-17 ENCOUNTER — Emergency Department (HOSPITAL_COMMUNITY): Payer: 59

## 2017-03-17 ENCOUNTER — Encounter (HOSPITAL_COMMUNITY): Payer: Self-pay | Admitting: *Deleted

## 2017-03-17 ENCOUNTER — Inpatient Hospital Stay (HOSPITAL_COMMUNITY)
Admission: EM | Admit: 2017-03-17 | Discharge: 2017-03-20 | DRG: 387 | Disposition: A | Discharge status: Home / Self Care | Payer: 59 | Attending: Internal Medicine | Admitting: Internal Medicine

## 2017-03-17 DIAGNOSIS — Z9049 Acquired absence of other specified parts of digestive tract: Secondary | ICD-10-CM

## 2017-03-17 DIAGNOSIS — E876 Hypokalemia: Secondary | ICD-10-CM | POA: Diagnosis present

## 2017-03-17 DIAGNOSIS — K5000 Crohn's disease of small intestine without complications: Secondary | ICD-10-CM | POA: Diagnosis not present

## 2017-03-17 DIAGNOSIS — R1030 Lower abdominal pain, unspecified: Secondary | ICD-10-CM

## 2017-03-17 DIAGNOSIS — R102 Pelvic and perineal pain: Secondary | ICD-10-CM

## 2017-03-17 DIAGNOSIS — N83201 Unspecified ovarian cyst, right side: Secondary | ICD-10-CM | POA: Diagnosis present

## 2017-03-17 DIAGNOSIS — R1031 Right lower quadrant pain: Secondary | ICD-10-CM | POA: Diagnosis not present

## 2017-03-17 DIAGNOSIS — D72829 Elevated white blood cell count, unspecified: Secondary | ICD-10-CM | POA: Diagnosis present

## 2017-03-17 DIAGNOSIS — R112 Nausea with vomiting, unspecified: Secondary | ICD-10-CM | POA: Diagnosis present

## 2017-03-17 DIAGNOSIS — K50918 Crohn's disease, unspecified, with other complication: Secondary | ICD-10-CM

## 2017-03-17 DIAGNOSIS — Z79899 Other long term (current) drug therapy: Secondary | ICD-10-CM

## 2017-03-17 LAB — COMPREHENSIVE METABOLIC PANEL
ALBUMIN: 4.3 g/dL (ref 3.5–5.0)
ALK PHOS: 67 U/L (ref 38–126)
ALT: 20 U/L (ref 14–54)
AST: 27 U/L (ref 15–41)
Anion gap: 10 (ref 5–15)
BILIRUBIN TOTAL: 0.5 mg/dL (ref 0.3–1.2)
BUN: 8 mg/dL (ref 6–20)
CALCIUM: 9.4 mg/dL (ref 8.9–10.3)
CO2: 22 mmol/L (ref 22–32)
Chloride: 105 mmol/L (ref 101–111)
Creatinine, Ser: 0.78 mg/dL (ref 0.44–1.00)
GFR calc Af Amer: >60 mL/min (ref >60)
GFR calc non Af Amer: >60 mL/min (ref >60)
GLUCOSE: 106 mg/dL — AB (ref 65–99)
POTASSIUM: 3.5 mmol/L (ref 3.5–5.1)
SODIUM: 137 mmol/L (ref 135–145)
TOTAL PROTEIN: 8.5 g/dL — AB (ref 6.5–8.1)

## 2017-03-17 LAB — CBC WITH DIFFERENTIAL/PLATELET
BASOS ABS: 0 10*3/uL (ref 0.0–0.1)
Basophils Relative: 0 %
EOS PCT: 1 %
Eosinophils Absolute: 0.1 10*3/uL (ref 0.0–0.7)
HEMATOCRIT: 36.9 % (ref 36.0–46.0)
Hemoglobin: 12.9 g/dL (ref 12.0–15.0)
LYMPHS PCT: 17 %
Lymphs Abs: 2.4 10*3/uL (ref 0.7–4.0)
MCH: 27.7 pg (ref 26.0–34.0)
MCHC: 35 g/dL (ref 30.0–36.0)
MCV: 79.2 fL (ref 78.0–100.0)
MONO ABS: 0.7 10*3/uL (ref 0.1–1.0)
MONOS PCT: 5 %
Neutro Abs: 10.7 10*3/uL — ABNORMAL HIGH (ref 1.7–7.7)
Neutrophils Relative %: 77 %
PLATELETS: 288 10*3/uL (ref 150–400)
RBC: 4.66 MIL/uL (ref 3.87–5.11)
RDW: 12.9 % (ref 11.5–15.5)
WBC: 14 10*3/uL — ABNORMAL HIGH (ref 4.0–10.5)

## 2017-03-17 LAB — URINALYSIS, ROUTINE W REFLEX MICROSCOPIC
Bilirubin Urine: NEGATIVE
GLUCOSE, UA: NEGATIVE mg/dL
HGB URINE DIPSTICK: NEGATIVE
Ketones, ur: NEGATIVE mg/dL
NITRITE: NEGATIVE
Protein, ur: NEGATIVE mg/dL
SPECIFIC GRAVITY, URINE: 1.021 (ref 1.005–1.030)
pH: 7 (ref 5.0–8.0)

## 2017-03-17 LAB — LIPASE, BLOOD: Lipase: 17 U/L (ref 11–51)

## 2017-03-17 LAB — POC URINE PREG, ED: Preg Test, Ur: NEGATIVE

## 2017-03-17 MED ORDER — OXYCODONE-ACETAMINOPHEN 5-325 MG PO TABS
1.0000 | ORAL_TABLET | ORAL | Status: DC | PRN
  Administered 2017-03-17: 1 via ORAL
  Filled 2017-03-17: qty 1

## 2017-03-17 MED ORDER — KETOROLAC TROMETHAMINE 30 MG/ML IJ SOLN
30.0000 mg | Freq: Once | INTRAMUSCULAR | Status: AC
  Administered 2017-03-17: 30 mg via INTRAVENOUS
  Filled 2017-03-17: qty 1

## 2017-03-17 MED ORDER — IOPAMIDOL (ISOVUE-300) INJECTION 61%
INTRAVENOUS | Status: AC
  Filled 2017-03-17: qty 30

## 2017-03-17 MED ORDER — MORPHINE SULFATE (PF) 2 MG/ML IV SOLN
4.0000 mg | Freq: Once | INTRAVENOUS | Status: AC
  Administered 2017-03-17: 4 mg via INTRAVENOUS
  Filled 2017-03-17: qty 2

## 2017-03-17 MED ORDER — ONDANSETRON HCL 4 MG/2ML IJ SOLN
4.0000 mg | Freq: Once | INTRAMUSCULAR | Status: AC
  Administered 2017-03-17: 4 mg via INTRAVENOUS
  Filled 2017-03-17: qty 2

## 2017-03-17 MED ORDER — IOPAMIDOL (ISOVUE-300) INJECTION 61%
100.0000 mL | Freq: Once | INTRAVENOUS | Status: AC | PRN
  Administered 2017-03-17: 100 mL via INTRAVENOUS

## 2017-03-17 MED ORDER — HYDROMORPHONE HCL 2 MG/ML IJ SOLN
1.0000 mg | Freq: Once | INTRAMUSCULAR | Status: AC
  Administered 2017-03-17: 1 mg via INTRAVENOUS
  Filled 2017-03-17: qty 1

## 2017-03-17 MED ORDER — IOPAMIDOL (ISOVUE-300) INJECTION 61%
30.0000 mL | Freq: Once | INTRAVENOUS | Status: AC | PRN
  Administered 2017-03-17: 30 mL via ORAL

## 2017-03-17 MED ORDER — IOPAMIDOL (ISOVUE-300) INJECTION 61%
INTRAVENOUS | Status: AC
  Filled 2017-03-17: qty 100

## 2017-03-17 MED ORDER — ONDANSETRON 4 MG PO TBDP
4.0000 mg | ORAL_TABLET | Freq: Once | ORAL | Status: AC | PRN
  Administered 2017-03-17: 4 mg via ORAL
  Filled 2017-03-17: qty 1

## 2017-03-17 MED ORDER — SODIUM CHLORIDE 0.9 % IV BOLUS (SEPSIS)
1000.0000 mL | Freq: Once | INTRAVENOUS | Status: AC
  Administered 2017-03-17: 1000 mL via INTRAVENOUS

## 2017-03-17 NOTE — ED Notes (Addendum)
Pt. States that she has Chrons Disease. Was diagnosed 2013

## 2017-03-17 NOTE — ED Provider Notes (Signed)
[facility] DEPT Provider Note   CSN: [id number] Arrival date & time: 03/17/17  1728     History   Chief Complaint Chief Complaint  Patient presents with  . Abdominal Pain    HPI Sarah Morrison is a 26 y.o. female.  Sarah Morrison is a 26 y.o. Female with a history of Crohn's disease who presents the emergency department complaining of lower abdominal pain with nausea and vomiting with onset this morning. Patient reports onset of lower abdominal pain that is worse in her right lower quadrant since this morning. Her pain is constant with waves of pain. She reports several episodes of nausea and vomiting today. She denies any diarrhea. Last bowel movement was yesterday. She denies passing gas today. She reports his pain feels like her typical Crohn's pain, however it is worse than usual. She is followed by a gastroenterologist in [facility] She takes Humira and has been compliant with her medications recently. On the previous abdominal surgical history is a cholecystectomy. Patient denies fevers, hematemesis, diarrhea, hematochezia, urinary symptoms, rashes, chest pain, shortness of breath, vaginal bleeding or vaginal discharge.   The history is provided by the patient, a parent and medical records. No language interpreter was used.  Abdominal Pain   Associated symptoms include nausea and vomiting. Pertinent negatives include fever, diarrhea, dysuria, frequency and headaches.    Past Medical History:  Diagnosis Date  . Dermoid cyst of ovary, 6.1cm by CT [id number] 10/11/2012  . Gastritis   . Ileitis, probable Crohn's disease 10/11/2012   CT 10/11/2012: Marked inflammatory findings in the terminal ileum and adjacent  cecum, with enlarged pericecal lymph nodes. The appearance favors  Crohn's disease/terminal ileitis, with infectious enterocolitis  less likely given the segmental involvement. No definite  extraluminal gas although there is mild complex ascites in the  pelvis.       Patient Active Problem List   Diagnosis Date Noted  . Ileitis, probable Crohn's disease 10/11/2012  . Reflux 10/11/2012  . Dermoid cyst of ovary, 6.1cm by CT [id number] 10/11/2012    Past Surgical History:  Procedure Laterality Date  . COLONOSCOPY  10/12/2012   Procedure: COLONOSCOPY;  Surgeon: Inda Castle, MD;  Location: [facility];  Service: Endoscopy;  Laterality: N/A;  . ESOPHAGOGASTRODUODENOSCOPY    . 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 LAPAROTOMY N/A 06/16/2013   Procedure: EXPLORATORY LAPAROTOMY With Removal of Left Ovarian Dermoid Cyst;  Surgeon: Thurnell Lose, MD;  Location: [facility];  Service: Gynecology;  Laterality: N/A;    OB History    Gravida Para Term Preterm AB Living   0             SAB TAB Ectopic Multiple Live Births                   Home Medications    Prior to Admission medications   Medication Sig Start Date End Date Taking? Authorizing Provider  acetaminophen (TYLENOL) 500 MG tablet Take 1,000 mg by mouth every 6 (six) hours as needed for mild pain.   Yes Historical Provider, MD  HUMIRA PEN 40 MG/0.8ML PNKT Inject 40 mg into the skin every 7 (seven) days. 03/04/17  Yes Historical Provider, MD  docusate sodium (COLACE) 100 MG capsule Take 1 capsule (100 mg total) by mouth daily. Patient not taking: Reported on 03/17/2017 10/20/16   Harlin Heys, MD  ondansetron (ZOFRAN) 4 MG tablet Take 1 tablet (4 mg total) by  mouth every 8 (eight) hours as needed for nausea or vomiting. Patient not taking: Reported on 03/17/2017 10/20/16   Harlin Heys, MD    Family History Family History  Problem Relation Age of Onset  . Adopted: Yes  . Colon cancer Neg Hx     Social History Social History  Substance Use Topics  . Smoking status: Never Smoker  . Smokeless tobacco: Never Used  . Alcohol use No     Allergies   Patient has no known allergies.   Review of Systems Review of Systems   Constitutional: Negative for chills and fever.  HENT: Negative for congestion and sore throat.   Eyes: Negative for visual disturbance.  Respiratory: Negative for cough and shortness of breath.   Cardiovascular: Negative for chest pain.  Gastrointestinal: Positive for abdominal pain, nausea and vomiting. Negative for blood in stool and diarrhea.  Genitourinary: Negative for difficulty urinating, dysuria, flank pain, frequency, pelvic pain, vaginal bleeding and vaginal discharge.  Musculoskeletal: Negative for back pain and neck pain.  Skin: Negative for rash.  Neurological: Negative for weakness, light-headedness and headaches.     Physical Exam Updated Vital Signs BP (!) 136/94 (BP Location: Left Arm)   Pulse 96   Temp 97.7 F (36.5 C) (Oral)   Resp (!) 24   Ht 5\' 7"  (1.702 m)   Wt 90.7 kg   LMP 02/22/2017   SpO2 98%   BMI 31.32 kg/m   Physical Exam  Constitutional: She appears well-developed and well-nourished. No distress.  Nontoxic appearing.  HENT:  Head: Normocephalic and atraumatic.  Mouth/Throat: Oropharynx is clear and moist.  Mucous membranes are moist.  Eyes: Conjunctivae are normal. Pupils are equal, round, and reactive to light. Right eye exhibits no discharge. Left eye exhibits no discharge.  Neck: Neck supple.  Cardiovascular: Normal rate, regular rhythm, normal heart sounds and intact distal pulses.  Exam reveals no gallop and no friction rub.   No murmur heard. Pulmonary/Chest: Effort normal and breath sounds normal. No respiratory distress. She has no wheezes. She has no rales.  Abdominal: Soft. Bowel sounds are normal. She exhibits no distension and no mass. There is tenderness. There is no rebound and no guarding.  Abdomen is soft. Bowel sounds are present. Patient has bilateral lower abdominal tenderness to palpation which is worse in her right lower quadrant. No CVA or flank tenderness.  Musculoskeletal: She exhibits no edema.  Lymphadenopathy:     She has no cervical adenopathy.  Neurological: She is alert. Coordination normal.  Skin: Skin is warm and dry. Capillary refill takes less than 2 seconds. No rash noted. She is not diaphoretic. No erythema. No pallor.  Psychiatric: She has a normal mood and affect. Her behavior is normal.  Nursing note and vitals reviewed.    ED Treatments / Results  Labs (all labs ordered are listed, but only abnormal results are displayed) Labs Reviewed  URINALYSIS, ROUTINE W REFLEX MICROSCOPIC - Abnormal; Notable for the following:       Result Value   APPearance HAZY (*)    Leukocytes, UA TRACE (*)    Bacteria, UA RARE (*)    Squamous Epithelial / LPF 6-30 (*)    All other components within normal limits  COMPREHENSIVE METABOLIC PANEL - Abnormal; Notable for the following:    Glucose, Bld 106 (*)    Total Protein 8.5 (*)    All other components within normal limits  CBC WITH DIFFERENTIAL/PLATELET - Abnormal; Notable for the following:  WBC 14.0 (*)    Neutro Abs 10.7 (*)    All other components within normal limits  LIPASE, BLOOD  POC URINE PREG, ED    EKG  EKG Interpretation None       Radiology No results found.  Procedures Procedures (including critical care time)  Medications Ordered in ED Medications  oxyCODONE-acetaminophen (PERCOCET/ROXICET) 5-325 MG per tablet 1 tablet (1 tablet Oral Given 03/17/17 1811)  iopamidol (ISOVUE-300) 61 % injection (not administered)  iopamidol (ISOVUE-300) 61 % injection (not administered)  ondansetron (ZOFRAN-ODT) disintegrating tablet 4 mg (4 mg Oral Given 03/17/17 1811)  sodium chloride 0.9 % bolus 1,000 mL (1,000 mLs Intravenous New Bag/Given 03/17/17 2036)  ondansetron (ZOFRAN) injection 4 mg (4 mg Intravenous Given 03/17/17 2038)  morphine 2 MG/ML injection 4 mg (4 mg Intravenous Given 03/17/17 2040)  ketorolac (TORADOL) 30 MG/ML injection 30 mg (30 mg Intravenous Given 03/17/17 2137)     Initial Impression / Assessment and Plan / ED  Course  I have reviewed the triage vital signs and the nursing notes.  Pertinent labs & imaging results that were available during my care of the patient were reviewed by me and considered in my medical decision making (see chart for details).    This is a 26 y.o. Female with a history of Crohn's disease who presents the emergency department complaining of lower abdominal pain with nausea and vomiting with onset this morning. Patient reports onset of lower abdominal pain that is worse in her right lower quadrant since this morning. Her pain is constant with waves of pain. She reports several episodes of nausea and vomiting today. She denies any diarrhea. Last bowel movement was yesterday. She denies passing gas today. She reports his pain feels like her typical Crohn's pain, however it is worse than usual.  On exam the patient is afebrile nontoxic appearing. She appears uncomfortable. She is bilateral lower abdominal pain that is worse in her right lower quadrant. CBC reveals a white count of 14,000. Pregnancy test is negative. Lipase is within normal limits. His CMP is unremarkable. UA shows only trace leukocytes.  At shift change patient is awaiting CT abdomen and pelvis. Patient care signed out to [facility], PA-C at shift change. He will reevaluate the patient disposition her accordingly.  Final Clinical Impressions(s) / ED Diagnoses   Final diagnoses:  Lower abdominal pain  Crohn's disease with other complication, unspecified gastrointestinal tract location ([place])  Non-intractable vomiting with nausea, unspecified vomiting type    New Prescriptions New Prescriptions   No medications on file     Waynetta Pean, PA-C 03/17/17 2226    Merrily Pew, MD 03/17/17 2231

## 2017-03-17 NOTE — ED Notes (Signed)
Pain medication given in Triage. Patient advised about side effects of medications and  to avoid driving for a minimum of 4 hours.  

## 2017-03-17 NOTE — ED Notes (Signed)
Pt still reporting pain was unaffected by morphine. Morphine was given at 2040

## 2017-03-17 NOTE — ED Triage Notes (Signed)
Pt complains of lower abdominal pain and nausea today. Pt has hx of chron's disease. Pt denies emesis or diarrhea.

## 2017-03-18 ENCOUNTER — Emergency Department (HOSPITAL_COMMUNITY): Payer: 59

## 2017-03-18 DIAGNOSIS — D72829 Elevated white blood cell count, unspecified: Secondary | ICD-10-CM | POA: Diagnosis not present

## 2017-03-18 DIAGNOSIS — K5000 Crohn's disease of small intestine without complications: Secondary | ICD-10-CM | POA: Diagnosis present

## 2017-03-18 DIAGNOSIS — R1030 Lower abdominal pain, unspecified: Secondary | ICD-10-CM | POA: Diagnosis not present

## 2017-03-18 DIAGNOSIS — R112 Nausea with vomiting, unspecified: Secondary | ICD-10-CM | POA: Diagnosis present

## 2017-03-18 DIAGNOSIS — K50918 Crohn's disease, unspecified, with other complication: Secondary | ICD-10-CM | POA: Diagnosis not present

## 2017-03-18 DIAGNOSIS — K5090 Crohn's disease, unspecified, without complications: Secondary | ICD-10-CM

## 2017-03-18 DIAGNOSIS — E876 Hypokalemia: Secondary | ICD-10-CM | POA: Diagnosis present

## 2017-03-18 DIAGNOSIS — Z9049 Acquired absence of other specified parts of digestive tract: Secondary | ICD-10-CM | POA: Diagnosis not present

## 2017-03-18 DIAGNOSIS — R102 Pelvic and perineal pain: Secondary | ICD-10-CM | POA: Diagnosis not present

## 2017-03-18 DIAGNOSIS — N83201 Unspecified ovarian cyst, right side: Secondary | ICD-10-CM | POA: Diagnosis present

## 2017-03-18 DIAGNOSIS — Z79899 Other long term (current) drug therapy: Secondary | ICD-10-CM | POA: Diagnosis not present

## 2017-03-18 DIAGNOSIS — R1031 Right lower quadrant pain: Secondary | ICD-10-CM | POA: Diagnosis present

## 2017-03-18 LAB — CBC
HCT: 37 % (ref 36.0–46.0)
HEMOGLOBIN: 12.9 g/dL (ref 12.0–15.0)
MCH: 27.7 pg (ref 26.0–34.0)
MCHC: 34.9 g/dL (ref 30.0–36.0)
MCV: 79.6 fL (ref 78.0–100.0)
Platelets: 254 10*3/uL (ref 150–400)
RBC: 4.65 MIL/uL (ref 3.87–5.11)
RDW: 13 % (ref 11.5–15.5)
WBC: 15.7 10*3/uL — AB (ref 4.0–10.5)

## 2017-03-18 LAB — BASIC METABOLIC PANEL
ANION GAP: 8 (ref 5–15)
BUN: 9 mg/dL (ref 6–20)
CO2: 23 mmol/L (ref 22–32)
Calcium: 8.9 mg/dL (ref 8.9–10.3)
Chloride: 105 mmol/L (ref 101–111)
Creatinine, Ser: 0.68 mg/dL (ref 0.44–1.00)
Glucose, Bld: 131 mg/dL — ABNORMAL HIGH (ref 65–99)
POTASSIUM: 3.6 mmol/L (ref 3.5–5.1)
SODIUM: 136 mmol/L (ref 135–145)

## 2017-03-18 MED ORDER — MORPHINE SULFATE (PF) 2 MG/ML IV SOLN: 2.0000 mg | INTRAVENOUS | Status: DC | PRN

## 2017-03-18 MED ORDER — ONDANSETRON HCL 4 MG PO TABS: 4.0000 mg | ORAL_TABLET | Freq: Four times a day (QID) | ORAL | Status: DC | PRN

## 2017-03-18 MED ORDER — MORPHINE SULFATE (PF) 4 MG/ML IV SOLN
2.0000 mg | INTRAVENOUS | Status: DC | PRN
  Administered 2017-03-18 (×2): 2 mg via INTRAVENOUS
  Filled 2017-03-18 (×2): qty 1

## 2017-03-18 MED ORDER — OXYCODONE HCL 5 MG PO TABS
5.0000 mg | ORAL_TABLET | ORAL | Status: DC | PRN
  Administered 2017-03-19 (×3): 5 mg via ORAL
  Filled 2017-03-18 (×4): qty 1

## 2017-03-18 MED ORDER — PREDNISONE 20 MG PO TABS
40.0000 mg | ORAL_TABLET | Freq: Every day | ORAL | Status: DC
  Administered 2017-03-18 – 2017-03-20 (×3): 40 mg via ORAL
  Filled 2017-03-18 (×3): qty 2

## 2017-03-18 MED ORDER — HYDROMORPHONE HCL 2 MG/ML IJ SOLN
1.0000 mg | Freq: Once | INTRAMUSCULAR | Status: AC
  Administered 2017-03-18: 1 mg via INTRAVENOUS
  Filled 2017-03-18: qty 1

## 2017-03-18 MED ORDER — ACETAMINOPHEN 650 MG RE SUPP
650.0000 mg | Freq: Four times a day (QID) | RECTAL | Status: DC | PRN
Start: 2017-03-18 — End: 2017-03-20

## 2017-03-18 MED ORDER — ENOXAPARIN SODIUM 40 MG/0.4ML ~~LOC~~ SOLN
40.0000 mg | SUBCUTANEOUS | Status: DC
  Administered 2017-03-18 – 2017-03-19 (×2): 40 mg via SUBCUTANEOUS
  Filled 2017-03-18 (×3): qty 0.4

## 2017-03-18 MED ORDER — MORPHINE SULFATE (PF) 4 MG/ML IV SOLN
2.0000 mg | INTRAVENOUS | Status: DC | PRN
  Administered 2017-03-18 (×3): 2 mg via SUBCUTANEOUS
  Filled 2017-03-18 (×3): qty 1

## 2017-03-18 MED ORDER — METHYLPREDNISOLONE SODIUM SUCC 125 MG IJ SOLR
125.0000 mg | Freq: Once | INTRAMUSCULAR | Status: AC
  Administered 2017-03-18: 125 mg via INTRAVENOUS
  Filled 2017-03-18: qty 2

## 2017-03-18 MED ORDER — ADALIMUMAB 40 MG/0.8ML ~~LOC~~ AJKT
40.0000 mg | AUTO-INJECTOR | SUBCUTANEOUS | Status: DC
  Administered 2017-03-18: 40 mg via SUBCUTANEOUS

## 2017-03-18 MED ORDER — ONDANSETRON HCL 4 MG/2ML IJ SOLN
4.0000 mg | Freq: Four times a day (QID) | INTRAMUSCULAR | Status: DC | PRN
  Administered 2017-03-18: 4 mg via INTRAVENOUS
  Filled 2017-03-18: qty 2

## 2017-03-18 MED ORDER — SODIUM CHLORIDE 0.9 % IV SOLN
INTRAVENOUS | Status: DC
  Administered 2017-03-18 – 2017-03-19 (×3): via INTRAVENOUS
  Administered 2017-03-19: 1000 mL via INTRAVENOUS
  Administered 2017-03-19: 100 mL/h via INTRAVENOUS
  Administered 2017-03-20: 1000 mL via INTRAVENOUS

## 2017-03-18 MED ORDER — ACETAMINOPHEN 325 MG PO TABS: 650.0000 mg | ORAL_TABLET | Freq: Four times a day (QID) | ORAL | Status: DC | PRN

## 2017-03-18 NOTE — Progress Notes (Signed)
03/18/17  Daisetta regarding patient medicine Adalimumab. Per pharmacy they do not carry this medicine and patient will have to bring it from home. Patient notified that she would have to provide medicine. Per pt her parents will bring medicine this evening.

## 2017-03-18 NOTE — ED Provider Notes (Signed)
2:39 AM Handoff to me at 10:30 PM from [facility]. Upon my initial exam, patient is crying, writhing in pain, and actively vomiting. Dilaudid and Zofran were ordered with good relief.  Given focal pain and cyst noted on CT, ultrasound ordered to rule out ovarian torsion. This was ultimately negative.  Patient reexamined and updated on results. Her pain is returning. Discussed discharge versus admission. We both agree that admission is likely the best option at this point. I've ordered additional pain medication and IV steroids.  Spoke with Dr. Tamala Julian who will see patient.  BP (!) 135/101   Pulse 91   Temp 97.7 F (36.5 C) (Oral)   Resp 18   Ht 5\' 7"  (1.702 m)   Wt 90.7 kg   LMP 02/22/2017 Comment: Upreg neg 03/17/17  SpO2 100%   BMI 31.32 kg/m     Carlisle Cater, PA-C 03/18/17 0245    Merrily Pew, MD 03/20/17 0001

## 2017-03-18 NOTE — H&P (Addendum)
History and Physical    HANG AMMON MWN:[id number] DOB: [birth year] DOA: 03/17/2017  Referring MD/NP/PA: Merrily Pew PCP: Vidal Schwalbe, MD  Patient coming from: Home  Chief Complaint: Abdominal pain  HPI: Sarah Morrison is a 26 y.o. female with medical history significant of Crohn's disease on Humira; who presents with complaints of right lower quadrant abdominal pain since yesterday. Patient reports pain waxes and wanes in intensity and feels like something is trying to get out of her. Associated symptoms symptoms included feeling hot and flushed with pain symptoms, nausea, vomiting (3-4 times since being in the ED), decreased appetite, and constipation (last bowel movement 2 days ago). She reports taking a laxative yesterday to try and help have a bowel movement. Denies any significant chest pain, diarrhea, dysuria, urinary frequency, or headache. She is followed by Dr. Brayton Layman gastroenterology at [facility]  ED Course: Upon admission into the emergency department patient was seen to be afebrile, respirations 18-24, pulse 85-96, and all other vitals are relatively within normal limits. Labs revealed WBC of 14. Urinalysis negative for signs of infection. Imaging studies revealed signs of acute terminal ileitis and hemorrhagic cyst. Patient was given Solu-Medrol 125 mg IV 1 dose, Dilaudid, and oxycodone while in the ED.  Review of Systems: As per HPI otherwise 10 point review of systems negative.   Past Medical History:  Diagnosis Date  . Dermoid cyst of ovary, 6.1cm by CT [id number] 10/11/2012  . Gastritis   . Ileitis, probable Crohn's disease 10/11/2012   CT 10/11/2012: Marked inflammatory findings in the terminal ileum and adjacent  cecum, with enlarged pericecal lymph nodes. The appearance favors  Crohn's disease/terminal ileitis, with infectious enterocolitis  less likely given the segmental involvement. No definite  extraluminal gas although there is mild complex ascites in the   pelvis.      Past Surgical History:  Procedure Laterality Date  . COLONOSCOPY  10/12/2012   Procedure: COLONOSCOPY;  Surgeon: Inda Castle, MD;  Location: [place];  Service: Endoscopy;  Laterality: N/A;  . ESOPHAGOGASTRODUODENOSCOPY    . 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 LAPAROTOMY N/A 06/16/2013   Procedure: EXPLORATORY LAPAROTOMY With Removal of Left Ovarian Dermoid Cyst;  Surgeon: Thurnell Lose, MD;  Location: [facility];  Service: Gynecology;  Laterality: N/A;     reports that she has never smoked. She has never used smokeless tobacco. She reports that she does not drink alcohol or use drugs.  No Known Allergies  Family History  Problem Relation Age of Onset  . Adopted: Yes  . Colon cancer Neg Hx     Prior to Admission medications   Medication Sig Start Date End Date Taking? Authorizing Provider  acetaminophen (TYLENOL) 500 MG tablet Take 1,000 mg by mouth every 6 (six) hours as needed for mild pain.   Yes Historical Provider, MD  HUMIRA PEN 40 MG/0.8ML PNKT Inject 40 mg into the skin every 7 (seven) days. 03/04/17  Yes Historical Provider, MD  docusate sodium (COLACE) 100 MG capsule Take 1 capsule (100 mg total) by mouth daily. Patient not taking: Reported on 03/17/2017 10/20/16   Harlin Heys, MD  ondansetron (ZOFRAN) 4 MG tablet Take 1 tablet (4 mg total) by mouth every 8 (eight) hours as needed for nausea or vomiting. Patient not taking: Reported on 03/17/2017 10/20/16   Harlin Heys, MD    Physical Exam:   Constitutional: Young female who appears to be a mild discomfort. Vitals:  03/17/17 1922 03/17/17 2126 03/18/17 0002 03/18/17 0205  BP: (!) 143/95 (!) 136/94 136/90 (!) 135/101  Pulse: 85 96 94 91  Resp: 18 (!) 24 18 18   Temp: 97.7 F (36.5 C)     TempSrc: Oral     SpO2: 98% 98% 99% 100%  Weight: 90.7 kg (200 lb)     Height: 5\' 7"  (1.702 m)      Eyes: PERRL, lids and conjunctivae normal ENMT:  Mucous membranes are dry. Posterior pharynx clear of any exudate or lesions. Normal dentition.  Neck: normal, supple, no masses, no thyromegaly Respiratory: clear to auscultation bilaterally, no wheezing, no crackles. Normal respiratory effort. No accessory muscle use.  Cardiovascular: Regular rate and rhythm, no murmurs / rubs / gallops. No extremity edema. 2+ pedal pulses. No carotid bruits.  Abdomen: Right lower quadrant tenderness. No masses palpated. No hepatosplenomegaly. Bowel sounds positive.  Musculoskeletal: no clubbing / cyanosis. No joint deformity upper and lower extremities. Good ROM, no contractures. Normal muscle tone.  Skin: no rashes, lesions, ulcers. No induration Neurologic: CN 2-12 grossly intact. Sensation intact, DTR normal. Strength 5/5 in all 4.  Psychiatric: Normal judgment and insight. Alert and oriented x 3. Normal mood.     Labs on Admission: I have personally reviewed following labs and imaging studies  CBC:  Recent Labs Lab 03/17/17 1950  WBC 14.0*  NEUTROABS 10.7*  HGB 12.9  HCT 36.9  MCV 79.2  PLT 024   Basic Metabolic Panel:  Recent Labs Lab 03/17/17 1810  NA 137  K 3.5  CL 105  CO2 22  GLUCOSE 106*  BUN 8  CREATININE 0.78  CALCIUM 9.4   GFR: Estimated Creatinine Clearance: 125.3 mL/min (by C-G formula based on SCr of 0.78 mg/dL). Liver Function Tests:  Recent Labs Lab 03/17/17 1810  AST 27  ALT 20  ALKPHOS 67  BILITOT 0.5  PROT 8.5*  ALBUMIN 4.3    Recent Labs Lab 03/17/17 1810  LIPASE 17   No results for input(s): AMMONIA in the last 168 hours. Coagulation Profile: No results for input(s): INR, PROTIME in the last 168 hours. Cardiac Enzymes: No results for input(s): CKTOTAL, CKMB, CKMBINDEX, TROPONINI in the last 168 hours. BNP (last 3 results) No results for input(s): PROBNP in the last 8760 hours. HbA1C: No results for input(s): HGBA1C in the last 72 hours. CBG: No results for input(s): GLUCAP in the last 168  hours. Lipid Profile: No results for input(s): CHOL, HDL, LDLCALC, TRIG, CHOLHDL, LDLDIRECT in the last 72 hours. Thyroid Function Tests: No results for input(s): TSH, T4TOTAL, FREET4, T3FREE, THYROIDAB in the last 72 hours. Anemia Panel: No results for input(s): VITAMINB12, FOLATE, FERRITIN, TIBC, IRON, RETICCTPCT in the last 72 hours. Urine analysis:    Component Value Date/Time   COLORURINE YELLOW 03/17/2017 1801   APPEARANCEUR HAZY (A) 03/17/2017 1801   LABSPEC 1.021 03/17/2017 1801   PHURINE 7.0 03/17/2017 1801   GLUCOSEU NEGATIVE 03/17/2017 1801   HGBUR NEGATIVE 03/17/2017 1801   BILIRUBINUR NEGATIVE 03/17/2017 1801   KETONESUR NEGATIVE 03/17/2017 1801   PROTEINUR NEGATIVE 03/17/2017 1801   UROBILINOGEN 1.0 11/25/2014 2006   NITRITE NEGATIVE 03/17/2017 1801   LEUKOCYTESUR TRACE (A) 03/17/2017 1801   Sepsis Labs: No results found for this or any previous visit (from the past 240 hour(s)).   Radiological Exams on Admission: US Transvaginal Non-ob  Result Date: 03/18/2017 CLINICAL DATA:  Initial evaluation for acute right lower quadrant abdominal pain. EXAM: TRANSABDOMINAL AND TRANSVAGINAL ULTRASOUND OF PELVIS DOPPLER  ULTRASOUND OF OVARIES TECHNIQUE: Both transabdominal and transvaginal ultrasound examinations of the pelvis were performed. Transabdominal technique was performed for global imaging of the pelvis including uterus, ovaries, adnexal regions, and pelvic cul-de-sac. It was necessary to proceed with endovaginal exam following the transabdominal exam to visualize the uterus and ovaries. Color and duplex Doppler ultrasound was utilized to evaluate blood flow to the ovaries. COMPARISON:  Prior CT performed earlier the same day. FINDINGS: Uterus Measurements: 6.1 x 3.3 x 4.1 cm. No fibroids or other mass visualized. Endometrium Thickness: 6.6 mm.  No focal abnormality visualized. Right ovary Measurements: 5.6 x 4.2 x 4.5 cm. 3.9 x 3.2 x 4.0 cm cyst seen arising from the right  ovary. Cyst demonstrates some internal lace-like architecture without internal vascularity, most consistent with a hemorrhagic cyst. Left ovary Measurements: 1.9 x 1.6 x 1.6 cm. Normal appearance/no adnexal mass. Pulsed Doppler evaluation of both ovaries demonstrates normal low-resistance arterial and venous waveforms. Other findings Small volume free fluid. Prominent peristalsing loops of bowel seen within the right lower quadrant, similar to previous CT. IMPRESSION: 1. 3.9 x 3.2 x 4.0 cm complex right ovarian cyst, most consistent with a hemorrhagic cyst. Given size, appearance, and patient age, no further follow-up is warranted for this lesion. 2. No evidence for ovarian torsion. 3. Small volume free fluid within the pelvis. 4. Prominent loop of bowel within the right lower quadrant, similar to prior CT. Finding suspected to be related to history of Crohn's disease. Electronically Signed   By: Jeannine Boga M.D.   On: 03/18/2017 01:34   US Pelvis Complete  Result Date: 03/18/2017 CLINICAL DATA:  Initial evaluation for acute right lower quadrant abdominal pain. EXAM: TRANSABDOMINAL AND TRANSVAGINAL ULTRASOUND OF PELVIS DOPPLER ULTRASOUND OF OVARIES TECHNIQUE: Both transabdominal and transvaginal ultrasound examinations of the pelvis were performed. Transabdominal technique was performed for global imaging of the pelvis including uterus, ovaries, adnexal regions, and pelvic cul-de-sac. It was necessary to proceed with endovaginal exam following the transabdominal exam to visualize the uterus and ovaries. Color and duplex Doppler ultrasound was utilized to evaluate blood flow to the ovaries. COMPARISON:  Prior CT performed earlier the same day. FINDINGS: Uterus Measurements: 6.1 x 3.3 x 4.1 cm. No fibroids or other mass visualized. Endometrium Thickness: 6.6 mm.  No focal abnormality visualized. Right ovary Measurements: 5.6 x 4.2 x 4.5 cm. 3.9 x 3.2 x 4.0 cm cyst seen arising from the right ovary. Cyst  demonstrates some internal lace-like architecture without internal vascularity, most consistent with a hemorrhagic cyst. Left ovary Measurements: 1.9 x 1.6 x 1.6 cm. Normal appearance/no adnexal mass. Pulsed Doppler evaluation of both ovaries demonstrates normal low-resistance arterial and venous waveforms. Other findings Small volume free fluid. Prominent peristalsing loops of bowel seen within the right lower quadrant, similar to previous CT. IMPRESSION: 1. 3.9 x 3.2 x 4.0 cm complex right ovarian cyst, most consistent with a hemorrhagic cyst. Given size, appearance, and patient age, no further follow-up is warranted for this lesion. 2. No evidence for ovarian torsion. 3. Small volume free fluid within the pelvis. 4. Prominent loop of bowel within the right lower quadrant, similar to prior CT. Finding suspected to be related to history of Crohn's disease. Electronically Signed   By: Jeannine Boga M.D.   On: 03/18/2017 01:34   Ct Abdomen Pelvis W Contrast  Result Date: 03/17/2017 CLINICAL DATA:  Initial evaluation for acute lower abdominal pain. History of Crohn's disease. EXAM: CT ABDOMEN AND PELVIS WITH CONTRAST TECHNIQUE: Multidetector CT imaging of  the abdomen and pelvis was performed using the standard protocol following bolus administration of intravenous contrast. CONTRAST:  56mL ISOVUE-300 IOPAMIDOL (ISOVUE-300) INJECTION 61%, 132mL ISOVUE-300 IOPAMIDOL (ISOVUE-300) INJECTION 61% COMPARISON:  Prior CT from 12/21/2015. FINDINGS: Lower chest: Visualized lung bases are clear. Hepatobiliary: Liver demonstrates a normal contrast enhanced appearance. Gallbladder surgically absent. No biliary dilatation. Pancreas: Pancreas within normal limits. Spleen: Spleen within normal limits. Adrenals/Urinary Tract: Adrenal glands are normal. Kidneys equal in size with symmetric enhancement. No nephrolithiasis, hydronephrosis, or focal enhancing renal mass. No hydroureter. Bladder within normal limits.  Stomach/Bowel: Stomach moderately distended without acute inflammatory changes. There is and enlarged featureless loop of terminal ileum within the right lower quadrant that measures up to 3.8 cm in diameter (series 2, image 70). Associated circumferential mucosal enhancement with intraluminal fluid density. Mild associated hazy perienteric stranding. Findings consistent with acute ileitis. Crohn's flare is suspected. Few mildly prominent fluid-filled loops of small bowel proximally without associated obstruction. No other findings to suggest acute appendicitis. Colon within normal limits without acute inflammatory changes. Vascular/Lymphatic: Normal intravascular enhancement seen throughout the intra-abdominal aorta and its branch vessels. Few scattered mildly prominent mesenteric nodes present within the right lower quadrant, likely reactive. No other adenopathy. Reproductive: Uterus and left ovary are unremarkable. 4.1 cm right adnexal cyst, indeterminate, but of doubtful significance in a patient of this age. Other: Small volume free fluid within the right pelvic cul-de-sac. No free intraperitoneal air. Small fat containing paraumbilical hernia noted. Musculoskeletal: No acute osseous abnormality. No worrisome lytic or blastic osseous lesions. IMPRESSION: 1. Findings consistent with acute terminal ileitis, most consistent with acute Crohn's flare. No complication identified. 2. 4.1 cm right ovarian cyst. This is most certainly benign given patient age and appearance. Small volume free fluid within the right pelvic cul-de-sac may be related to the cyst and/or the inflamed ileum. Electronically Signed   By: Jeannine Boga M.D.   On: 03/17/2017 23:02   [place] Art/ven Flow Abd Pelv Doppler  Result Date: 03/18/2017 CLINICAL DATA:  Initial evaluation for acute right lower quadrant abdominal pain. EXAM: TRANSABDOMINAL AND TRANSVAGINAL ULTRASOUND OF PELVIS DOPPLER ULTRASOUND OF OVARIES TECHNIQUE: Both  transabdominal and transvaginal ultrasound examinations of the pelvis were performed. Transabdominal technique was performed for global imaging of the pelvis including uterus, ovaries, adnexal regions, and pelvic cul-de-sac. It was necessary to proceed with endovaginal exam following the transabdominal exam to visualize the uterus and ovaries. Color and duplex Doppler ultrasound was utilized to evaluate blood flow to the ovaries. COMPARISON:  Prior CT performed earlier the same day. FINDINGS: Uterus Measurements: 6.1 x 3.3 x 4.1 cm. No fibroids or other mass visualized. Endometrium Thickness: 6.6 mm.  No focal abnormality visualized. Right ovary Measurements: 5.6 x 4.2 x 4.5 cm. 3.9 x 3.2 x 4.0 cm cyst seen arising from the right ovary. Cyst demonstrates some internal lace-like architecture without internal vascularity, most consistent with a hemorrhagic cyst. Left ovary Measurements: 1.9 x 1.6 x 1.6 cm. Normal appearance/no adnexal mass. Pulsed Doppler evaluation of both ovaries demonstrates normal low-resistance arterial and venous waveforms. Other findings Small volume free fluid. Prominent peristalsing loops of bowel seen within the right lower quadrant, similar to previous CT. IMPRESSION: 1. 3.9 x 3.2 x 4.0 cm complex right ovarian cyst, most consistent with a hemorrhagic cyst. Given size, appearance, and patient age, no further follow-up is warranted for this lesion. 2. No evidence for ovarian torsion. 3. Small volume free fluid within the pelvis. 4. Prominent loop of bowel within the right lower  quadrant, similar to prior CT. Finding suspected to be related to history of Crohn's disease. Electronically Signed   By: Jeannine Boga M.D.   On: 03/18/2017 01:34     Assessment/Plan Crohn's flare of terminal ileus/right lower quadrant abdominal pain: Acute. Patient presents with right lower quadrant abdominal pain. Imaging studies reveal acute terminal ileitis as likely cause of symptoms. Patient was  given 125 mg of Solu-Medrol IV in the ED. - Admit to a MedSurg bed - Prednisone 40 mg po daily  - IVF NS at 100 ml/hr - Oxycodone/morphine IV prn moderate to severe pain respectively - Continue Humira -  May want to contact Dr. Brayton Layman gastroenterology at [place] in a.m.  Leukocytosis: WBC elevated at 14. Likely secondary to above. Antibiotics thought to be of limited utility in Crohn's flare. - Continue to monitor - Consider need of antibiotics, if clinically not improving  Nausea and vomiting: Acute. - NPO for now, but advance diet as tolerated - Zofran IV  Hemorrhagic ovarian cyst: As seen on imaging studies. No further workup warranted   DVT prophylaxis: lovenox Code Status: Full  Family Communication: No family present at bedside Disposition Plan: Discharge home if medically stable  Consults called: None Admission status: Observation  Norval Morton MD [facility] Pager [phone]  If 7PM-7AM, please contact night-coverage [email or web address] Password [facility]  03/18/2017, 2:29 AM

## 2017-03-18 NOTE — Progress Notes (Signed)
PROGRESS NOTE  Sarah Morrison  AST:[id number] DOB: 10/24/1991 DOA: 03/17/2017 PCP: Vidal Schwalbe, MD Outpatient Specialists:  Subjective: Still has some abdominal pain, nausea, started to have some diarrhea now. Continue current treatment.  Brief Narrative:  Sarah Morrison is a 26 y.o. female with medical history significant of Crohn's disease on Humira; who presents with complaints of right lower quadrant abdominal pain since yesterday. Patient reports pain waxes and wanes in intensity and feels like something is trying to get out of her. Associated symptoms symptoms included feeling hot and flushed with pain symptoms, nausea, vomiting (3-4 times since being in the ED), decreased appetite, and constipation (last bowel movement 2 days ago). She reports taking a laxative yesterday to try and help have a bowel movement. Denies any significant chest pain, diarrhea, dysuria, urinary frequency, or headache. She is followed by Dr. Brayton Layman gastroenterology at [facility].  Assessment & Plan:   Principal Problem:   Crohn's disease involving terminal ileum ([place]) Active Problems:   Nausea and vomiting   Leukocytosis   This is a no charge note, patient seen earlier today by my colleague Dr. Tamala Julian. Patient presented with abdominal pain, secondary to Crohn's disease terminal ileitis. Started on clear liquids and steroids.  Crohn's flare of terminal ileus/right lower quadrant abdominal pain: Acute. Patient presents with right lower quadrant abdominal pain. Imaging studies reveal acute terminal ileitis as likely cause of symptoms. Patient was given 125 mg of Solu-Medrol IV in the ED. - Admit to a MedSurg bed - Prednisone 40 mg po daily  - IVF NS at 100 ml/hr - Oxycodone/morphine IV prn moderate to severe pain respectively - Continue Humira -  May want to contact Dr. Brayton Layman gastroenterology at [place] in a.m.  Leukocytosis: WBC elevated at 14. Likely secondary to above.  Antibiotics thought to be of limited utility in Crohn's flare. - Continue to monitor - Consider need of antibiotics, if clinically not improving  Nausea and vomiting: Acute. - NPO for now, but advance diet as tolerated - Zofran IV  Hemorrhagic ovarian cyst: As seen on imaging studies. No further workup warranted    DVT prophylaxis:  Code Status: Full Code Family Communication:  Disposition Plan:  Diet: Diet clear liquid Room service appropriate? Yes; Fluid consistency: Thin  Consultants:   None  Procedures:   None  Antimicrobials:   None   Objective: Vitals:   03/17/17 2126 03/18/17 0002 03/18/17 0205 03/18/17 0421  BP: (!) 136/94 136/90 (!) 135/101 (!) 143/93  Pulse: 96 94 91 98  Resp: (!) 24 18 18 20   Temp:    99 F (37.2 C)  TempSrc:    Oral  SpO2: 98% 99% 100% 99%  Weight:    92.1 kg (203 lb 0.7 oz)  Height:    5\' 7"  (1.702 m)    Intake/Output Summary (Last 24 hours) at 03/18/17 1101 Last data filed at 03/18/17 0900  Gross per 24 hour  Intake          1458.33 ml  Output                0 ml  Net          1458.33 ml   Filed Weights   03/17/17 1922 03/18/17 0421  Weight: 90.7 kg (200 lb) 92.1 kg (203 lb 0.7 oz)    Examination: General exam: Appears calm and comfortable  Respiratory system: Clear to auscultation. Respiratory effort normal. Cardiovascular system: S1 & S2 heard, RRR. No  JVD, murmurs, rubs, gallops or clicks. No pedal edema. Gastrointestinal system: Abdomen is nondistended, soft and nontender. No organomegaly or masses felt. Normal bowel sounds heard. Central nervous system: Alert and oriented. No focal neurological deficits. Extremities: Symmetric 5 x 5 power. Skin: No rashes, lesions or ulcers Psychiatry: Judgement and insight appear normal. Mood & affect appropriate.   Data Reviewed: I have personally reviewed following labs and imaging studies  CBC:  Recent Labs Lab 03/17/17 1950 03/18/17 0507  WBC 14.0* 15.7*  NEUTROABS  10.7*  --   HGB 12.9 12.9  HCT 36.9 37.0  MCV 79.2 79.6  PLT 288 409   Basic Metabolic Panel:  Recent Labs Lab 03/17/17 1810 03/18/17 0507  NA 137 136  K 3.5 3.6  CL 105 105  CO2 22 23  GLUCOSE 106* 131*  BUN 8 9  CREATININE 0.78 0.68  CALCIUM 9.4 8.9   GFR: Estimated Creatinine Clearance: 126.3 mL/min (by C-G formula based on SCr of 0.68 mg/dL). Liver Function Tests:  Recent Labs Lab 03/17/17 1810  AST 27  ALT 20  ALKPHOS 67  BILITOT 0.5  PROT 8.5*  ALBUMIN 4.3    Recent Labs Lab 03/17/17 1810  LIPASE 17   No results for input(s): AMMONIA in the last 168 hours. Coagulation Profile: No results for input(s): INR, PROTIME in the last 168 hours. Cardiac Enzymes: No results for input(s): CKTOTAL, CKMB, CKMBINDEX, TROPONINI in the last 168 hours. BNP (last 3 results) No results for input(s): PROBNP in the last 8760 hours. HbA1C: No results for input(s): HGBA1C in the last 72 hours. CBG: No results for input(s): GLUCAP in the last 168 hours. Lipid Profile: No results for input(s): CHOL, HDL, LDLCALC, TRIG, CHOLHDL, LDLDIRECT in the last 72 hours. Thyroid Function Tests: No results for input(s): TSH, T4TOTAL, FREET4, T3FREE, THYROIDAB in the last 72 hours. Anemia Panel: No results for input(s): VITAMINB12, FOLATE, FERRITIN, TIBC, IRON, RETICCTPCT in the last 72 hours. Urine analysis:    Component Value Date/Time   COLORURINE YELLOW 03/17/2017 1801   APPEARANCEUR HAZY (A) 03/17/2017 1801   LABSPEC 1.021 03/17/2017 1801   PHURINE 7.0 03/17/2017 1801   GLUCOSEU NEGATIVE 03/17/2017 1801   HGBUR NEGATIVE 03/17/2017 1801   BILIRUBINUR NEGATIVE 03/17/2017 1801   KETONESUR NEGATIVE 03/17/2017 1801   PROTEINUR NEGATIVE 03/17/2017 1801   UROBILINOGEN 1.0 11/25/2014 2006   NITRITE NEGATIVE 03/17/2017 1801   LEUKOCYTESUR TRACE (A) 03/17/2017 1801   Sepsis Labs: @LABRCNTIP (procalcitonin:4,lacticidven:4)  )No results found for this or any previous visit (from  the past 240 hour(s)).   Invalid input(s): PROCALCITONIN, LACTICACIDVEN   Radiology Studies: US Transvaginal Non-ob  Result Date: 03/18/2017 CLINICAL DATA:  Initial evaluation for acute right lower quadrant abdominal pain. EXAM: TRANSABDOMINAL AND TRANSVAGINAL ULTRASOUND OF PELVIS DOPPLER ULTRASOUND OF OVARIES TECHNIQUE: Both transabdominal and transvaginal ultrasound examinations of the pelvis were performed. Transabdominal technique was performed for global imaging of the pelvis including uterus, ovaries, adnexal regions, and pelvic cul-de-sac. It was necessary to proceed with endovaginal exam following the transabdominal exam to visualize the uterus and ovaries. Color and duplex Doppler ultrasound was utilized to evaluate blood flow to the ovaries. COMPARISON:  Prior CT performed earlier the same day. FINDINGS: Uterus Measurements: 6.1 x 3.3 x 4.1 cm. No fibroids or other mass visualized. Endometrium Thickness: 6.6 mm.  No focal abnormality visualized. Right ovary Measurements: 5.6 x 4.2 x 4.5 cm. 3.9 x 3.2 x 4.0 cm cyst seen arising from the right ovary. Cyst demonstrates some internal lace-like  architecture without internal vascularity, most consistent with a hemorrhagic cyst. Left ovary Measurements: 1.9 x 1.6 x 1.6 cm. Normal appearance/no adnexal mass. Pulsed Doppler evaluation of both ovaries demonstrates normal low-resistance arterial and venous waveforms. Other findings Small volume free fluid. Prominent peristalsing loops of bowel seen within the right lower quadrant, similar to previous CT. IMPRESSION: 1. 3.9 x 3.2 x 4.0 cm complex right ovarian cyst, most consistent with a hemorrhagic cyst. Given size, appearance, and patient age, no further follow-up is warranted for this lesion. 2. No evidence for ovarian torsion. 3. Small volume free fluid within the pelvis. 4. Prominent loop of bowel within the right lower quadrant, similar to prior CT. Finding suspected to be related to history of Crohn's  disease. Electronically Signed   By: Jeannine Boga M.D.   On: 03/18/2017 01:34   US Pelvis Complete  Result Date: 03/18/2017 CLINICAL DATA:  Initial evaluation for acute right lower quadrant abdominal pain. EXAM: TRANSABDOMINAL AND TRANSVAGINAL ULTRASOUND OF PELVIS DOPPLER ULTRASOUND OF OVARIES TECHNIQUE: Both transabdominal and transvaginal ultrasound examinations of the pelvis were performed. Transabdominal technique was performed for global imaging of the pelvis including uterus, ovaries, adnexal regions, and pelvic cul-de-sac. It was necessary to proceed with endovaginal exam following the transabdominal exam to visualize the uterus and ovaries. Color and duplex Doppler ultrasound was utilized to evaluate blood flow to the ovaries. COMPARISON:  Prior CT performed earlier the same day. FINDINGS: Uterus Measurements: 6.1 x 3.3 x 4.1 cm. No fibroids or other mass visualized. Endometrium Thickness: 6.6 mm.  No focal abnormality visualized. Right ovary Measurements: 5.6 x 4.2 x 4.5 cm. 3.9 x 3.2 x 4.0 cm cyst seen arising from the right ovary. Cyst demonstrates some internal lace-like architecture without internal vascularity, most consistent with a hemorrhagic cyst. Left ovary Measurements: 1.9 x 1.6 x 1.6 cm. Normal appearance/no adnexal mass. Pulsed Doppler evaluation of both ovaries demonstrates normal low-resistance arterial and venous waveforms. Other findings Small volume free fluid. Prominent peristalsing loops of bowel seen within the right lower quadrant, similar to previous CT. IMPRESSION: 1. 3.9 x 3.2 x 4.0 cm complex right ovarian cyst, most consistent with a hemorrhagic cyst. Given size, appearance, and patient age, no further follow-up is warranted for this lesion. 2. No evidence for ovarian torsion. 3. Small volume free fluid within the pelvis. 4. Prominent loop of bowel within the right lower quadrant, similar to prior CT. Finding suspected to be related to history of Crohn's disease.  Electronically Signed   By: Jeannine Boga M.D.   On: 03/18/2017 01:34   Ct Abdomen Pelvis W Contrast  Result Date: 03/17/2017 CLINICAL DATA:  Initial evaluation for acute lower abdominal pain. History of Crohn's disease. EXAM: CT ABDOMEN AND PELVIS WITH CONTRAST TECHNIQUE: Multidetector CT imaging of the abdomen and pelvis was performed using the standard protocol following bolus administration of intravenous contrast. CONTRAST:  14mL ISOVUE-300 IOPAMIDOL (ISOVUE-300) INJECTION 61%, 130mL ISOVUE-300 IOPAMIDOL (ISOVUE-300) INJECTION 61% COMPARISON:  Prior CT from 12/21/2015. FINDINGS: Lower chest: Visualized lung bases are clear. Hepatobiliary: Liver demonstrates a normal contrast enhanced appearance. Gallbladder surgically absent. No biliary dilatation. Pancreas: Pancreas within normal limits. Spleen: Spleen within normal limits. Adrenals/Urinary Tract: Adrenal glands are normal. Kidneys equal in size with symmetric enhancement. No nephrolithiasis, hydronephrosis, or focal enhancing renal mass. No hydroureter. Bladder within normal limits. Stomach/Bowel: Stomach moderately distended without acute inflammatory changes. There is and enlarged featureless loop of terminal ileum within the right lower quadrant that measures up to 3.8 cm in diameter (  series 2, image 70). Associated circumferential mucosal enhancement with intraluminal fluid density. Mild associated hazy perienteric stranding. Findings consistent with acute ileitis. Crohn's flare is suspected. Few mildly prominent fluid-filled loops of small bowel proximally without associated obstruction. No other findings to suggest acute appendicitis. Colon within normal limits without acute inflammatory changes. Vascular/Lymphatic: Normal intravascular enhancement seen throughout the intra-abdominal aorta and its branch vessels. Few scattered mildly prominent mesenteric nodes present within the right lower quadrant, likely reactive. No other adenopathy.  Reproductive: Uterus and left ovary are unremarkable. 4.1 cm right adnexal cyst, indeterminate, but of doubtful significance in a patient of this age. Other: Small volume free fluid within the right pelvic cul-de-sac. No free intraperitoneal air. Small fat containing paraumbilical hernia noted. Musculoskeletal: No acute osseous abnormality. No worrisome lytic or blastic osseous lesions. IMPRESSION: 1. Findings consistent with acute terminal ileitis, most consistent with acute Crohn's flare. No complication identified. 2. 4.1 cm right ovarian cyst. This is most certainly benign given patient age and appearance. Small volume free fluid within the right pelvic cul-de-sac may be related to the cyst and/or the inflamed ileum. Electronically Signed   By: Jeannine Boga M.D.   On: 03/17/2017 23:02   [place] Art/ven Flow Abd Pelv Doppler  Result Date: 03/18/2017 CLINICAL DATA:  Initial evaluation for acute right lower quadrant abdominal pain. EXAM: TRANSABDOMINAL AND TRANSVAGINAL ULTRASOUND OF PELVIS DOPPLER ULTRASOUND OF OVARIES TECHNIQUE: Both transabdominal and transvaginal ultrasound examinations of the pelvis were performed. Transabdominal technique was performed for global imaging of the pelvis including uterus, ovaries, adnexal regions, and pelvic cul-de-sac. It was necessary to proceed with endovaginal exam following the transabdominal exam to visualize the uterus and ovaries. Color and duplex Doppler ultrasound was utilized to evaluate blood flow to the ovaries. COMPARISON:  Prior CT performed earlier the same day. FINDINGS: Uterus Measurements: 6.1 x 3.3 x 4.1 cm. No fibroids or other mass visualized. Endometrium Thickness: 6.6 mm.  No focal abnormality visualized. Right ovary Measurements: 5.6 x 4.2 x 4.5 cm. 3.9 x 3.2 x 4.0 cm cyst seen arising from the right ovary. Cyst demonstrates some internal lace-like architecture without internal vascularity, most consistent with a hemorrhagic cyst. Left ovary  Measurements: 1.9 x 1.6 x 1.6 cm. Normal appearance/no adnexal mass. Pulsed Doppler evaluation of both ovaries demonstrates normal low-resistance arterial and venous waveforms. Other findings Small volume free fluid. Prominent peristalsing loops of bowel seen within the right lower quadrant, similar to previous CT. IMPRESSION: 1. 3.9 x 3.2 x 4.0 cm complex right ovarian cyst, most consistent with a hemorrhagic cyst. Given size, appearance, and patient age, no further follow-up is warranted for this lesion. 2. No evidence for ovarian torsion. 3. Small volume free fluid within the pelvis. 4. Prominent loop of bowel within the right lower quadrant, similar to prior CT. Finding suspected to be related to history of Crohn's disease. Electronically Signed   By: Jeannine Boga M.D.   On: 03/18/2017 01:34        Scheduled Meds: . Adalimumab  40 mg Subcutaneous Q7 days  . enoxaparin (LOVENOX) injection  40 mg Subcutaneous Q24H  . predniSONE  40 mg Oral Q breakfast   Continuous Infusions: . sodium chloride 100 mL/hr at 03/18/17 0425     LOS: 0 days    Time spent: 35 minutes    Otillia Cordone A, MD [facility] Pager [phone]  If 7PM-7AM, please contact night-coverage [email or web address] Password [email or web address] 03/18/2017, 11:01 AM

## 2017-03-19 DIAGNOSIS — K50918 Crohn's disease, unspecified, with other complication: Secondary | ICD-10-CM

## 2017-03-19 DIAGNOSIS — R112 Nausea with vomiting, unspecified: Secondary | ICD-10-CM

## 2017-03-19 DIAGNOSIS — R1030 Lower abdominal pain, unspecified: Secondary | ICD-10-CM

## 2017-03-19 DIAGNOSIS — D72829 Elevated white blood cell count, unspecified: Secondary | ICD-10-CM

## 2017-03-19 LAB — CBC
HEMATOCRIT: 34.4 % — AB (ref 36.0–46.0)
HEMOGLOBIN: 11.6 g/dL — AB (ref 12.0–15.0)
MCH: 27.6 pg (ref 26.0–34.0)
MCHC: 33.7 g/dL (ref 30.0–36.0)
MCV: 81.9 fL (ref 78.0–100.0)
Platelets: 267 10*3/uL (ref 150–400)
RBC: 4.2 MIL/uL (ref 3.87–5.11)
RDW: 13.2 % (ref 11.5–15.5)
WBC: 14.8 10*3/uL — ABNORMAL HIGH (ref 4.0–10.5)

## 2017-03-19 LAB — BASIC METABOLIC PANEL
ANION GAP: 6 (ref 5–15)
BUN: 7 mg/dL (ref 6–20)
CALCIUM: 8.5 mg/dL — AB (ref 8.9–10.3)
CO2: 23 mmol/L (ref 22–32)
Chloride: 109 mmol/L (ref 101–111)
Creatinine, Ser: 0.71 mg/dL (ref 0.44–1.00)
GFR calc Af Amer: >60 mL/min (ref >60)
GFR calc non Af Amer: >60 mL/min (ref >60)
GLUCOSE: 91 mg/dL (ref 65–99)
Potassium: 3.2 mmol/L — ABNORMAL LOW (ref 3.5–5.1)
Sodium: 138 mmol/L (ref 135–145)

## 2017-03-19 MED ORDER — POTASSIUM CHLORIDE CRYS ER 20 MEQ PO TBCR
40.0000 meq | EXTENDED_RELEASE_TABLET | Freq: Four times a day (QID) | ORAL | Status: AC
  Administered 2017-03-19 (×2): 40 meq via ORAL
  Filled 2017-03-19 (×2): qty 2

## 2017-03-19 NOTE — Progress Notes (Signed)
PROGRESS NOTE    Sarah MATARESE  ERX:[id number] DOB: Sep 07, 1991 DOA: 03/17/2017 PCP: Vidal Schwalbe, MD   Subjective: Patient states that she feels better than yesterday. Her abdominal pain is dull and achy compared to it being sharp yesterday. Patient has been walking around the floor with ease and had one episode of diarrhea this morning. States she doesn't have an appetite this morning because she doesn't like the hospital food.   Brief Narrative: 26 y.o.femalewith medical history significant of Crohn's disease on Humira; who presents with complaints of right lower quadrant abdominal pain since yesterday. Patient reports pain waxes and wanes in intensity and feels like something is trying to get out of her. Associated symptoms symptoms included feeling hot and flushed with pain symptoms, nausea, vomiting (3-4 times since being in the ED), decreased appetite, and constipation (last bowel movement 2 days ago). She reports taking a laxative yesterday to try and help have a bowel movement. Denies any significant chest pain, diarrhea, dysuria, urinary frequency, or headache.She is followed by Dr. Brayton Layman gastroenterology at [facility].  Assessment & Plan:   Principal Problem:   Crohn's disease involving terminal ileum ([place]) Active Problems:   Nausea and vomiting   Leukocytosis   Crohn's flare of terminal ileus/right lower quadrant abdominal pain:Acute. Patient presents with right lower quadrant abdominal pain. Imaging studies reveal acute terminal ileitis as likely cause of symptoms. Patient was given 125 mg of Solu-Medrol IV in the ED. - Admit to a MedSurg bed - Prednisone 40 mg po daily  - IVF NS at 100 ml/hr - Oxycodone/morphine IV prn moderate to severe painrespectively - Continue Humira - Given that patient is still having diarrhea and unresolved abdominal pain, she will be kept overnight with plans to discharge tomorrow. She is encouraged to ambulate as much as she  can tolerate today.   Leukocytosis:  - WBC elevated at 14 on admission. Likely secondary to above. Antibiotics thought to be of limited utility in Crohn's flare.  - Trending down from 15.6 >> 14.8 today and patient remains afebrile. Leukocytosis can be secondary to steroid use as well.  - Continue to monitor and will consider antibiotics if patient does not improve clinically   Hypokalemia - Potassium today 3.2, will replete with 80 mEq PO.   Nausea andvomiting: Acute. - Patient is tolerating full liquid diet without nausea or vomiting. Continue to advance as tolerated - Zofran IV prn  Hemorrhagic ovarian cyst:  - As seen on imaging studies. No further workup warranted  DVT prophylaxis: Lovenox Code Status: Full Family Communication: None at bedside Disposition Plan: Home  Consultants:   None  Procedures:   None  Antimicrobials:  None   Objective: Vitals:   03/18/17 1143 03/18/17 1515 03/18/17 2057 03/19/17 0444  BP: (!) 148/87 (!) 148/90 132/89 132/78  Pulse: 89 86 99 81  Resp: 18 17 18 18   Temp: 98.5 F (36.9 C) 98.6 F (37 C) 98.8 F (37.1 C) 99.2 F (37.3 C)  TempSrc: Oral Oral Oral Oral  SpO2: 98% 100% 100% 100%  Weight:      Height:        Intake/Output Summary (Last 24 hours) at 03/19/17 1308 Last data filed at 03/19/17 1017  Gross per 24 hour  Intake             2890 ml  Output             1100 ml  Net  1790 ml   Filed Weights   03/17/17 1922 03/18/17 0421  Weight: 90.7 kg (200 lb) 92.1 kg (203 lb 0.7 oz)    Examination:  General exam: Appears calm and comfortable  Respiratory system: Clear to auscultation. Respiratory effort normal. Cardiovascular system: S1 & S2 heard, RRR. No JVD, murmurs, rubs, gallops or clicks. No pedal edema. Gastrointestinal system: Abdomen is nondistended, soft. Mild diffuse TTP. Normal bowel sounds heard. Central nervous system: Alert and oriented. No focal neurological deficits. Extremities:  Symmetric 5 x 5 power. Skin: No rashes, lesions or ulcers Psychiatry: Judgement and insight appear normal. Mood & affect appropriate.     Data Reviewed: I have personally reviewed following labs and imaging studies  CBC:  Recent Labs Lab 03/17/17 1950 03/18/17 0507 03/19/17 0436  WBC 14.0* 15.7* 14.8*  NEUTROABS 10.7*  --   --   HGB 12.9 12.9 11.6*  HCT 36.9 37.0 34.4*  MCV 79.2 79.6 81.9  PLT 288 254 662   Basic Metabolic Panel:  Recent Labs Lab 03/17/17 1810 03/18/17 0507 03/19/17 0436  NA 137 136 138  K 3.5 3.6 3.2*  CL 105 105 109  CO2 22 23 23   GLUCOSE 106* 131* 91  BUN 8 9 7   CREATININE 0.78 0.68 0.71  CALCIUM 9.4 8.9 8.5*   GFR: Estimated Creatinine Clearance: 126.3 mL/min (by C-G formula based on SCr of 0.71 mg/dL). Liver Function Tests:  Recent Labs Lab 03/17/17 1810  AST 27  ALT 20  ALKPHOS 67  BILITOT 0.5  PROT 8.5*  ALBUMIN 4.3    Recent Labs Lab 03/17/17 1810  LIPASE 17    No results found for this or any previous visit (from the past 240 hour(s)).    Radiology Studies: US Transvaginal Non-ob  Result Date: 03/18/2017 CLINICAL DATA:  Initial evaluation for acute right lower quadrant abdominal pain. EXAM: TRANSABDOMINAL AND TRANSVAGINAL ULTRASOUND OF PELVIS DOPPLER ULTRASOUND OF OVARIES TECHNIQUE: Both transabdominal and transvaginal ultrasound examinations of the pelvis were performed. Transabdominal technique was performed for global imaging of the pelvis including uterus, ovaries, adnexal regions, and pelvic cul-de-sac. It was necessary to proceed with endovaginal exam following the transabdominal exam to visualize the uterus and ovaries. Color and duplex Doppler ultrasound was utilized to evaluate blood flow to the ovaries. COMPARISON:  Prior CT performed earlier the same day. FINDINGS: Uterus Measurements: 6.1 x 3.3 x 4.1 cm. No fibroids or other mass visualized. Endometrium Thickness: 6.6 mm.  No focal abnormality visualized. Right  ovary Measurements: 5.6 x 4.2 x 4.5 cm. 3.9 x 3.2 x 4.0 cm cyst seen arising from the right ovary. Cyst demonstrates some internal lace-like architecture without internal vascularity, most consistent with a hemorrhagic cyst. Left ovary Measurements: 1.9 x 1.6 x 1.6 cm. Normal appearance/no adnexal mass. Pulsed Doppler evaluation of both ovaries demonstrates normal low-resistance arterial and venous waveforms. Other findings Small volume free fluid. Prominent peristalsing loops of bowel seen within the right lower quadrant, similar to previous CT. IMPRESSION: 1. 3.9 x 3.2 x 4.0 cm complex right ovarian cyst, most consistent with a hemorrhagic cyst. Given size, appearance, and patient age, no further follow-up is warranted for this lesion. 2. No evidence for ovarian torsion. 3. Small volume free fluid within the pelvis. 4. Prominent loop of bowel within the right lower quadrant, similar to prior CT. Finding suspected to be related to history of Crohn's disease. Electronically Signed   By: Jeannine Boga M.D.   On: 03/18/2017 01:34   US Pelvis Complete  Result Date: 03/18/2017 CLINICAL DATA:  Initial evaluation for acute right lower quadrant abdominal pain. EXAM: TRANSABDOMINAL AND TRANSVAGINAL ULTRASOUND OF PELVIS DOPPLER ULTRASOUND OF OVARIES TECHNIQUE: Both transabdominal and transvaginal ultrasound examinations of the pelvis were performed. Transabdominal technique was performed for global imaging of the pelvis including uterus, ovaries, adnexal regions, and pelvic cul-de-sac. It was necessary to proceed with endovaginal exam following the transabdominal exam to visualize the uterus and ovaries. Color and duplex Doppler ultrasound was utilized to evaluate blood flow to the ovaries. COMPARISON:  Prior CT performed earlier the same day. FINDINGS: Uterus Measurements: 6.1 x 3.3 x 4.1 cm. No fibroids or other mass visualized. Endometrium Thickness: 6.6 mm.  No focal abnormality visualized. Right ovary  Measurements: 5.6 x 4.2 x 4.5 cm. 3.9 x 3.2 x 4.0 cm cyst seen arising from the right ovary. Cyst demonstrates some internal lace-like architecture without internal vascularity, most consistent with a hemorrhagic cyst. Left ovary Measurements: 1.9 x 1.6 x 1.6 cm. Normal appearance/no adnexal mass. Pulsed Doppler evaluation of both ovaries demonstrates normal low-resistance arterial and venous waveforms. Other findings Small volume free fluid. Prominent peristalsing loops of bowel seen within the right lower quadrant, similar to previous CT. IMPRESSION: 1. 3.9 x 3.2 x 4.0 cm complex right ovarian cyst, most consistent with a hemorrhagic cyst. Given size, appearance, and patient age, no further follow-up is warranted for this lesion. 2. No evidence for ovarian torsion. 3. Small volume free fluid within the pelvis. 4. Prominent loop of bowel within the right lower quadrant, similar to prior CT. Finding suspected to be related to history of Crohn's disease. Electronically Signed   By: Jeannine Boga M.D.   On: 03/18/2017 01:34   Ct Abdomen Pelvis W Contrast  Result Date: 03/17/2017 CLINICAL DATA:  Initial evaluation for acute lower abdominal pain. History of Crohn's disease. EXAM: CT ABDOMEN AND PELVIS WITH CONTRAST TECHNIQUE: Multidetector CT imaging of the abdomen and pelvis was performed using the standard protocol following bolus administration of intravenous contrast. CONTRAST:  67mL ISOVUE-300 IOPAMIDOL (ISOVUE-300) INJECTION 61%, 152mL ISOVUE-300 IOPAMIDOL (ISOVUE-300) INJECTION 61% COMPARISON:  Prior CT from 12/21/2015. FINDINGS: Lower chest: Visualized lung bases are clear. Hepatobiliary: Liver demonstrates a normal contrast enhanced appearance. Gallbladder surgically absent. No biliary dilatation. Pancreas: Pancreas within normal limits. Spleen: Spleen within normal limits. Adrenals/Urinary Tract: Adrenal glands are normal. Kidneys equal in size with symmetric enhancement. No nephrolithiasis,  hydronephrosis, or focal enhancing renal mass. No hydroureter. Bladder within normal limits. Stomach/Bowel: Stomach moderately distended without acute inflammatory changes. There is and enlarged featureless loop of terminal ileum within the right lower quadrant that measures up to 3.8 cm in diameter (series 2, image 70). Associated circumferential mucosal enhancement with intraluminal fluid density. Mild associated hazy perienteric stranding. Findings consistent with acute ileitis. Crohn's flare is suspected. Few mildly prominent fluid-filled loops of small bowel proximally without associated obstruction. No other findings to suggest acute appendicitis. Colon within normal limits without acute inflammatory changes. Vascular/Lymphatic: Normal intravascular enhancement seen throughout the intra-abdominal aorta and its branch vessels. Few scattered mildly prominent mesenteric nodes present within the right lower quadrant, likely reactive. No other adenopathy. Reproductive: Uterus and left ovary are unremarkable. 4.1 cm right adnexal cyst, indeterminate, but of doubtful significance in a patient of this age. Other: Small volume free fluid within the right pelvic cul-de-sac. No free intraperitoneal air. Small fat containing paraumbilical hernia noted. Musculoskeletal: No acute osseous abnormality. No worrisome lytic or blastic osseous lesions. IMPRESSION: 1. Findings consistent with acute terminal ileitis, most  consistent with acute Crohn's flare. No complication identified. 2. 4.1 cm right ovarian cyst. This is most certainly benign given patient age and appearance. Small volume free fluid within the right pelvic cul-de-sac may be related to the cyst and/or the inflamed ileum. Electronically Signed   By: Jeannine Boga M.D.   On: 03/17/2017 23:02   [place] Art/ven Flow Abd Pelv Doppler  Result Date: 03/18/2017 CLINICAL DATA:  Initial evaluation for acute right lower quadrant abdominal pain. EXAM: TRANSABDOMINAL AND  TRANSVAGINAL ULTRASOUND OF PELVIS DOPPLER ULTRASOUND OF OVARIES TECHNIQUE: Both transabdominal and transvaginal ultrasound examinations of the pelvis were performed. Transabdominal technique was performed for global imaging of the pelvis including uterus, ovaries, adnexal regions, and pelvic cul-de-sac. It was necessary to proceed with endovaginal exam following the transabdominal exam to visualize the uterus and ovaries. Color and duplex Doppler ultrasound was utilized to evaluate blood flow to the ovaries. COMPARISON:  Prior CT performed earlier the same day. FINDINGS: Uterus Measurements: 6.1 x 3.3 x 4.1 cm. No fibroids or other mass visualized. Endometrium Thickness: 6.6 mm.  No focal abnormality visualized. Right ovary Measurements: 5.6 x 4.2 x 4.5 cm. 3.9 x 3.2 x 4.0 cm cyst seen arising from the right ovary. Cyst demonstrates some internal lace-like architecture without internal vascularity, most consistent with a hemorrhagic cyst. Left ovary Measurements: 1.9 x 1.6 x 1.6 cm. Normal appearance/no adnexal mass. Pulsed Doppler evaluation of both ovaries demonstrates normal low-resistance arterial and venous waveforms. Other findings Small volume free fluid. Prominent peristalsing loops of bowel seen within the right lower quadrant, similar to previous CT. IMPRESSION: 1. 3.9 x 3.2 x 4.0 cm complex right ovarian cyst, most consistent with a hemorrhagic cyst. Given size, appearance, and patient age, no further follow-up is warranted for this lesion. 2. No evidence for ovarian torsion. 3. Small volume free fluid within the pelvis. 4. Prominent loop of bowel within the right lower quadrant, similar to prior CT. Finding suspected to be related to history of Crohn's disease. Electronically Signed   By: Jeannine Boga M.D.   On: 03/18/2017 01:34        Scheduled Meds: . Adalimumab  40 mg Subcutaneous Q7 days  . enoxaparin (LOVENOX) injection  40 mg Subcutaneous Q24H  . predniSONE  40 mg Oral Q breakfast    Continuous Infusions: . sodium chloride 100 mL/hr at 03/19/17 1030     LOS: 1 day    Time spent: 30 minutes  Hajar Sakhi, PA-Student   If 7PM-7AM, please contact night-coverage [email or web address] Password [email or web address] 03/19/2017, 1:08 PM   Birdie Hopes Pager: [phone] 03/19/2017, 1:47 PM

## 2017-03-20 DIAGNOSIS — R102 Pelvic and perineal pain: Secondary | ICD-10-CM

## 2017-03-20 LAB — CBC
HCT: 32.9 % — ABNORMAL LOW (ref 36.0–46.0)
Hemoglobin: 10.9 g/dL — ABNORMAL LOW (ref 12.0–15.0)
MCH: 26.5 pg (ref 26.0–34.0)
MCHC: 33.1 g/dL (ref 30.0–36.0)
MCV: 79.9 fL (ref 78.0–100.0)
Platelets: 253 10*3/uL (ref 150–400)
RBC: 4.12 MIL/uL (ref 3.87–5.11)
RDW: 13 % (ref 11.5–15.5)
WBC: 10 10*3/uL (ref 4.0–10.5)

## 2017-03-20 LAB — BASIC METABOLIC PANEL
Anion gap: 5 (ref 5–15)
BUN: 6 mg/dL (ref 6–20)
CALCIUM: 8.5 mg/dL — AB (ref 8.9–10.3)
CO2: 24 mmol/L (ref 22–32)
Chloride: 110 mmol/L (ref 101–111)
Creatinine, Ser: 0.68 mg/dL (ref 0.44–1.00)
GFR calc Af Amer: >60 mL/min (ref >60)
GLUCOSE: 81 mg/dL (ref 65–99)
Potassium: 3.5 mmol/L (ref 3.5–5.1)
Sodium: 139 mmol/L (ref 135–145)

## 2017-03-20 MED ORDER — PREDNISONE 10 MG PO TABS: ORAL_TABLET | ORAL | 0 refills | Status: DC

## 2017-03-20 MED ORDER — HYDROCODONE-ACETAMINOPHEN 5-325 MG PO TABS: 1.0000 | ORAL_TABLET | Freq: Four times a day (QID) | ORAL | 0 refills | Status: DC | PRN

## 2017-03-20 NOTE — Progress Notes (Signed)
Pt's vitals are WNL, tolerating diet and pain is under control. Discussed discharge instructions with patients. All questions and concerns addressed. Discharged to home with prescriptions

## 2017-03-20 NOTE — Discharge Summary (Addendum)
Physician Discharge Summary  Sarah Morrison KGY:[id number] DOB: May 01, 1991 DOA: 03/17/2017  PCP: Vidal Schwalbe, MD  Admit date: 03/17/2017 Discharge date: 03/20/2017  Admitted From: Home Disposition: Home  Recommendations for Outpatient Follow-up:  1. Follow up with PCP in 1-2 weeks 2. Please obtain BMP/CBC in one week  Home Health: NA Equipment/Devices:NA  Discharge Condition: Stable CODE STATUS: Full Code Diet recommendation: Diet full liquid Room service appropriate? Yes; Fluid consistency: Thin Diet - low sodium heart healthy  Brief/Interim Summary: 26 y.o.femalewith medical history significant of Crohn's disease on Humira; who presents with complaints of right lower quadrant abdominal pain since yesterday. Patient reports pain waxes and wanes in intensity and feels like something is trying to get out of her. Associated symptoms symptoms included feeling hot and flushed with pain symptoms, nausea, vomiting (3-4 times since being in the ED), decreased appetite, and constipation (last bowel movement 2 days ago). She reports taking a laxative yesterday to try and help have a bowel movement. Denies any significant chest pain, diarrhea, dysuria, urinary frequency, or headache.She is followed by Dr. Brayton Layman gastroenterology at [facility].   Discharge Diagnoses:  Principal Problem:   Crohn's disease involving terminal ileum ([place]) Active Problems:   Nausea and vomiting   Leukocytosis   Crohn's flare of terminal ileus/right lower quadrant abdominal pain:Acute.  -Patient presents with right lower quadrant abdominal pain. Imaging studies reveal acute terminal ileitis as likely cause of symptoms. Patient was given 125 mg of Solu-Medrol IV in the ED. - Continue Humira - Initially kept nothing by mouth for bowel rest, diet advanced as the pain and the diarrhea improved areas -She tolerated full liquids, requested to go home on soft diet. -Discharged home on prednisone taper  and to follow-up with her primary care physician and primary gastroenterologist. -Requests a prescription for Vicodin, 10 tablets prescribed.  Leukocytosis:  - WBC elevated at 14 on admission. Likely secondary to above. Antibiotics thought to be of limited utility in Crohn's flare.  - Antibiotics trended down even with a steroids, WBCs were 10 on discharge.  Hypokalemia - Potassium today 3.2, repleted with oral supplements.  Nausea andvomiting: Acute. - Patient is tolerating full liquid diet without nausea or vomiting. Continue to advance as tolerated - Antibiotics as needed, she has Zofran at home.  Hemorrhagic ovarian cyst:  - Incidental finding seen on imaging studies. No further workup warranted  Discharge Instructions  Discharge Instructions    Diet - low sodium heart healthy    Complete by:  As directed    Increase activity slowly    Complete by:  As directed      Allergies as of 03/20/2017   No Known Allergies     Medication List    TAKE these medications   acetaminophen 500 MG tablet Commonly known as:  TYLENOL Take 1,000 mg by mouth every 6 (six) hours as needed for mild pain.   docusate sodium 100 MG capsule Commonly known as:  COLACE Take 1 capsule (100 mg total) by mouth daily.   HUMIRA PEN 40 MG/0.8ML Pnkt Generic drug:  Adalimumab Inject 40 mg into the skin every 7 (seven) days.   HYDROcodone-acetaminophen 5-325 MG tablet Commonly known as:  NORCO Take 1 tablet by mouth every 6 (six) hours as needed for moderate pain.   ondansetron 4 MG tablet Commonly known as:  ZOFRAN Take 1 tablet (4 mg total) by mouth every 8 (eight) hours as needed for nausea or vomiting.   predniSONE 10 MG tablet  Commonly known as:  DELTASONE Take 4 tabs PO for 2 days, then take 3 tabs PO for 2 days, then take 2 tabs PO for 2 days, then take 1 tabs PO for 2 days, then stop.       No Known Allergies  Consultations:  None  Procedures (Echo, Carotid, EGD,  Colonoscopy, ERCP)   Radiological studies: US Transvaginal Non-ob  Result Date: 03/18/2017 CLINICAL DATA:  Initial evaluation for acute right lower quadrant abdominal pain. EXAM: TRANSABDOMINAL AND TRANSVAGINAL ULTRASOUND OF PELVIS DOPPLER ULTRASOUND OF OVARIES TECHNIQUE: Both transabdominal and transvaginal ultrasound examinations of the pelvis were performed. Transabdominal technique was performed for global imaging of the pelvis including uterus, ovaries, adnexal regions, and pelvic cul-de-sac. It was necessary to proceed with endovaginal exam following the transabdominal exam to visualize the uterus and ovaries. Color and duplex Doppler ultrasound was utilized to evaluate blood flow to the ovaries. COMPARISON:  Prior CT performed earlier the same day. FINDINGS: Uterus Measurements: 6.1 x 3.3 x 4.1 cm. No fibroids or other mass visualized. Endometrium Thickness: 6.6 mm.  No focal abnormality visualized. Right ovary Measurements: 5.6 x 4.2 x 4.5 cm. 3.9 x 3.2 x 4.0 cm cyst seen arising from the right ovary. Cyst demonstrates some internal lace-like architecture without internal vascularity, most consistent with a hemorrhagic cyst. Left ovary Measurements: 1.9 x 1.6 x 1.6 cm. Normal appearance/no adnexal mass. Pulsed Doppler evaluation of both ovaries demonstrates normal low-resistance arterial and venous waveforms. Other findings Small volume free fluid. Prominent peristalsing loops of bowel seen within the right lower quadrant, similar to previous CT. IMPRESSION: 1. 3.9 x 3.2 x 4.0 cm complex right ovarian cyst, most consistent with a hemorrhagic cyst. Given size, appearance, and patient age, no further follow-up is warranted for this lesion. 2. No evidence for ovarian torsion. 3. Small volume free fluid within the pelvis. 4. Prominent loop of bowel within the right lower quadrant, similar to prior CT. Finding suspected to be related to history of Crohn's disease. Electronically Signed   By: Jeannine Boga M.D.   On: 03/18/2017 01:34   US Pelvis Complete  Result Date: 03/18/2017 CLINICAL DATA:  Initial evaluation for acute right lower quadrant abdominal pain. EXAM: TRANSABDOMINAL AND TRANSVAGINAL ULTRASOUND OF PELVIS DOPPLER ULTRASOUND OF OVARIES TECHNIQUE: Both transabdominal and transvaginal ultrasound examinations of the pelvis were performed. Transabdominal technique was performed for global imaging of the pelvis including uterus, ovaries, adnexal regions, and pelvic cul-de-sac. It was necessary to proceed with endovaginal exam following the transabdominal exam to visualize the uterus and ovaries. Color and duplex Doppler ultrasound was utilized to evaluate blood flow to the ovaries. COMPARISON:  Prior CT performed earlier the same day. FINDINGS: Uterus Measurements: 6.1 x 3.3 x 4.1 cm. No fibroids or other mass visualized. Endometrium Thickness: 6.6 mm.  No focal abnormality visualized. Right ovary Measurements: 5.6 x 4.2 x 4.5 cm. 3.9 x 3.2 x 4.0 cm cyst seen arising from the right ovary. Cyst demonstrates some internal lace-like architecture without internal vascularity, most consistent with a hemorrhagic cyst. Left ovary Measurements: 1.9 x 1.6 x 1.6 cm. Normal appearance/no adnexal mass. Pulsed Doppler evaluation of both ovaries demonstrates normal low-resistance arterial and venous waveforms. Other findings Small volume free fluid. Prominent peristalsing loops of bowel seen within the right lower quadrant, similar to previous CT. IMPRESSION: 1. 3.9 x 3.2 x 4.0 cm complex right ovarian cyst, most consistent with a hemorrhagic cyst. Given size, appearance, and patient age, no further follow-up is warranted for this lesion.  2. No evidence for ovarian torsion. 3. Small volume free fluid within the pelvis. 4. Prominent loop of bowel within the right lower quadrant, similar to prior CT. Finding suspected to be related to history of Crohn's disease. Electronically Signed   By: Jeannine Boga  M.D.   On: 03/18/2017 01:34   Ct Abdomen Pelvis W Contrast  Result Date: 03/17/2017 CLINICAL DATA:  Initial evaluation for acute lower abdominal pain. History of Crohn's disease. EXAM: CT ABDOMEN AND PELVIS WITH CONTRAST TECHNIQUE: Multidetector CT imaging of the abdomen and pelvis was performed using the standard protocol following bolus administration of intravenous contrast. CONTRAST:  65mL ISOVUE-300 IOPAMIDOL (ISOVUE-300) INJECTION 61%, 118mL ISOVUE-300 IOPAMIDOL (ISOVUE-300) INJECTION 61% COMPARISON:  Prior CT from 12/21/2015. FINDINGS: Lower chest: Visualized lung bases are clear. Hepatobiliary: Liver demonstrates a normal contrast enhanced appearance. Gallbladder surgically absent. No biliary dilatation. Pancreas: Pancreas within normal limits. Spleen: Spleen within normal limits. Adrenals/Urinary Tract: Adrenal glands are normal. Kidneys equal in size with symmetric enhancement. No nephrolithiasis, hydronephrosis, or focal enhancing renal mass. No hydroureter. Bladder within normal limits. Stomach/Bowel: Stomach moderately distended without acute inflammatory changes. There is and enlarged featureless loop of terminal ileum within the right lower quadrant that measures up to 3.8 cm in diameter (series 2, image 70). Associated circumferential mucosal enhancement with intraluminal fluid density. Mild associated hazy perienteric stranding. Findings consistent with acute ileitis. Crohn's flare is suspected. Few mildly prominent fluid-filled loops of small bowel proximally without associated obstruction. No other findings to suggest acute appendicitis. Colon within normal limits without acute inflammatory changes. Vascular/Lymphatic: Normal intravascular enhancement seen throughout the intra-abdominal aorta and its branch vessels. Few scattered mildly prominent mesenteric nodes present within the right lower quadrant, likely reactive. No other adenopathy. Reproductive: Uterus and left ovary are unremarkable.  4.1 cm right adnexal cyst, indeterminate, but of doubtful significance in a patient of this age. Other: Small volume free fluid within the right pelvic cul-de-sac. No free intraperitoneal air. Small fat containing paraumbilical hernia noted. Musculoskeletal: No acute osseous abnormality. No worrisome lytic or blastic osseous lesions. IMPRESSION: 1. Findings consistent with acute terminal ileitis, most consistent with acute Crohn's flare. No complication identified. 2. 4.1 cm right ovarian cyst. This is most certainly benign given patient age and appearance. Small volume free fluid within the right pelvic cul-de-sac may be related to the cyst and/or the inflamed ileum. Electronically Signed   By: Jeannine Boga M.D.   On: 03/17/2017 23:02   [place] Art/ven Flow Abd Pelv Doppler  Result Date: 03/18/2017 CLINICAL DATA:  Initial evaluation for acute right lower quadrant abdominal pain. EXAM: TRANSABDOMINAL AND TRANSVAGINAL ULTRASOUND OF PELVIS DOPPLER ULTRASOUND OF OVARIES TECHNIQUE: Both transabdominal and transvaginal ultrasound examinations of the pelvis were performed. Transabdominal technique was performed for global imaging of the pelvis including uterus, ovaries, adnexal regions, and pelvic cul-de-sac. It was necessary to proceed with endovaginal exam following the transabdominal exam to visualize the uterus and ovaries. Color and duplex Doppler ultrasound was utilized to evaluate blood flow to the ovaries. COMPARISON:  Prior CT performed earlier the same day. FINDINGS: Uterus Measurements: 6.1 x 3.3 x 4.1 cm. No fibroids or other mass visualized. Endometrium Thickness: 6.6 mm.  No focal abnormality visualized. Right ovary Measurements: 5.6 x 4.2 x 4.5 cm. 3.9 x 3.2 x 4.0 cm cyst seen arising from the right ovary. Cyst demonstrates some internal lace-like architecture without internal vascularity, most consistent with a hemorrhagic cyst. Left ovary Measurements: 1.9 x 1.6 x 1.6 cm. Normal appearance/no  adnexal  mass. Pulsed Doppler evaluation of both ovaries demonstrates normal low-resistance arterial and venous waveforms. Other findings Small volume free fluid. Prominent peristalsing loops of bowel seen within the right lower quadrant, similar to previous CT. IMPRESSION: 1. 3.9 x 3.2 x 4.0 cm complex right ovarian cyst, most consistent with a hemorrhagic cyst. Given size, appearance, and patient age, no further follow-up is warranted for this lesion. 2. No evidence for ovarian torsion. 3. Small volume free fluid within the pelvis. 4. Prominent loop of bowel within the right lower quadrant, similar to prior CT. Finding suspected to be related to history of Crohn's disease. Electronically Signed   By: Jeannine Boga M.D.   On: 03/18/2017 01:34    Subjective:  Discharge Exam: Vitals:   03/19/17 0444 03/19/17 1437 03/19/17 2107 03/20/17 0515  BP: 132/78 123/76 (!) 135/95 130/68  Pulse: 81 71 85 63  Resp: 18 16 18 18   Temp: 99.2 F (37.3 C) 99.4 F (37.4 C) 98.5 F (36.9 C) 98.1 F (36.7 C)  TempSrc: Oral Oral Oral Oral  SpO2: 100% 99% 99% 99%  Weight:      Height:       General: Pt is alert, awake, not in acute distress Cardiovascular: RRR, S1/S2 +, no rubs, no gallops Respiratory: CTA bilaterally, no wheezing, no rhonchi Abdominal: Soft, NT, ND, bowel sounds + Extremities: no edema, no cyanosis   The results of significant diagnostics from this hospitalization (including imaging, microbiology, ancillary and laboratory) are listed below for reference.    Microbiology: No results found for this or any previous visit (from the past 240 hour(s)).   Labs: BNP (last 3 results) No results for input(s): BNP in the last 8760 hours. Basic Metabolic Panel:  Recent Labs Lab 03/17/17 1810 03/18/17 0507 03/19/17 0436 03/20/17 0449  NA 137 136 138 139  K 3.5 3.6 3.2* 3.5  CL 105 105 109 110  CO2 22 23 23 24   GLUCOSE 106* 131* 91 81  BUN 8 9 7 6   CREATININE 0.78 0.68 0.71 0.68   CALCIUM 9.4 8.9 8.5* 8.5*   Liver Function Tests:  Recent Labs Lab 03/17/17 1810  AST 27  ALT 20  ALKPHOS 67  BILITOT 0.5  PROT 8.5*  ALBUMIN 4.3    Recent Labs Lab 03/17/17 1810  LIPASE 17   No results for input(s): AMMONIA in the last 168 hours. CBC:  Recent Labs Lab 03/17/17 1950 03/18/17 0507 03/19/17 0436 03/20/17 0449  WBC 14.0* 15.7* 14.8* 10.0  NEUTROABS 10.7*  --   --   --   HGB 12.9 12.9 11.6* 10.9*  HCT 36.9 37.0 34.4* 32.9*  MCV 79.2 79.6 81.9 79.9  PLT 288 254 267 253   Cardiac Enzymes: No results for input(s): CKTOTAL, CKMB, CKMBINDEX, TROPONINI in the last 168 hours. BNP: Invalid input(s): POCBNP CBG: No results for input(s): GLUCAP in the last 168 hours. D-Dimer No results for input(s): DDIMER in the last 72 hours. Hgb A1c No results for input(s): HGBA1C in the last 72 hours. Lipid Profile No results for input(s): CHOL, HDL, LDLCALC, TRIG, CHOLHDL, LDLDIRECT in the last 72 hours. Thyroid function studies No results for input(s): TSH, T4TOTAL, T3FREE, THYROIDAB in the last 72 hours.  Invalid input(s): FREET3 Anemia work up No results for input(s): VITAMINB12, FOLATE, FERRITIN, TIBC, IRON, RETICCTPCT in the last 72 hours. Urinalysis    Component Value Date/Time   COLORURINE YELLOW 03/17/2017 1801   APPEARANCEUR HAZY (A) 03/17/2017 1801   LABSPEC 1.021 03/17/2017 1801  PHURINE 7.0 03/17/2017 1801   GLUCOSEU NEGATIVE 03/17/2017 1801   HGBUR NEGATIVE 03/17/2017 1801   BILIRUBINUR NEGATIVE 03/17/2017 1801   KETONESUR NEGATIVE 03/17/2017 1801   PROTEINUR NEGATIVE 03/17/2017 1801   UROBILINOGEN 1.0 11/25/2014 2006   NITRITE NEGATIVE 03/17/2017 1801   LEUKOCYTESUR TRACE (A) 03/17/2017 1801   Sepsis Labs Invalid input(s): PROCALCITONIN,  WBC,  LACTICIDVEN Microbiology No results found for this or any previous visit (from the past 240 hour(s)).   Time coordinating discharge: Over 30 minutes  SIGNED:   Birdie Hopes, MD  [facility] 03/20/2017, 10:14 AM Pager   If 7PM-7AM, please contact night-coverage [email or web address] Password [email or web address]

## 2017-05-21 ENCOUNTER — Other Ambulatory Visit: Payer: Self-pay | Admitting: Obstetrics and Gynecology

## 2017-05-21 ENCOUNTER — Other Ambulatory Visit (HOSPITAL_COMMUNITY)
Admission: RE | Admit: 2017-05-21 | Discharge: 2017-05-21 | Disposition: A | Discharge status: Home / Self Care | Payer: 59 | Source: Ambulatory Visit | Attending: Obstetrics and Gynecology | Admitting: Obstetrics and Gynecology

## 2017-05-21 DIAGNOSIS — Z01419 Encounter for gynecological examination (general) (routine) without abnormal findings: Secondary | ICD-10-CM | POA: Diagnosis present

## 2017-05-25 LAB — CYTOLOGY - PAP: Diagnosis: NEGATIVE

## 2017-07-08 ENCOUNTER — Ambulatory Visit: Payer: Self-pay

## 2017-07-08 ENCOUNTER — Other Ambulatory Visit: Payer: Self-pay | Admitting: Occupational Medicine

## 2017-07-08 DIAGNOSIS — Z0000 Encounter for general adult medical examination without abnormal findings: Secondary | ICD-10-CM

## 2019-02-07 DIAGNOSIS — E538 Deficiency of other specified B group vitamins: Secondary | ICD-10-CM | POA: Diagnosis not present

## 2019-02-07 DIAGNOSIS — K50919 Crohn's disease, unspecified, with unspecified complications: Secondary | ICD-10-CM | POA: Diagnosis not present

## 2019-02-07 DIAGNOSIS — Z79899 Other long term (current) drug therapy: Secondary | ICD-10-CM | POA: Diagnosis not present

## 2019-02-07 DIAGNOSIS — Z6830 Body mass index (BMI) 30.0-30.9, adult: Secondary | ICD-10-CM | POA: Diagnosis not present

## 2019-02-07 DIAGNOSIS — E611 Iron deficiency: Secondary | ICD-10-CM | POA: Diagnosis not present

## 2019-05-29 DIAGNOSIS — R102 Pelvic and perineal pain: Secondary | ICD-10-CM | POA: Diagnosis not present

## 2019-06-13 DIAGNOSIS — Z113 Encounter for screening for infections with a predominantly sexual mode of transmission: Secondary | ICD-10-CM | POA: Diagnosis not present

## 2019-06-13 DIAGNOSIS — N898 Other specified noninflammatory disorders of vagina: Secondary | ICD-10-CM | POA: Diagnosis not present

## 2019-06-13 DIAGNOSIS — N83209 Unspecified ovarian cyst, unspecified side: Secondary | ICD-10-CM | POA: Diagnosis not present

## 2019-07-31 DIAGNOSIS — Z7689 Persons encountering health services in other specified circumstances: Secondary | ICD-10-CM | POA: Diagnosis not present

## 2019-07-31 DIAGNOSIS — Z202 Contact with and (suspected) exposure to infections with a predominantly sexual mode of transmission: Secondary | ICD-10-CM | POA: Diagnosis not present

## 2019-07-31 DIAGNOSIS — Z3202 Encounter for pregnancy test, result negative: Secondary | ICD-10-CM | POA: Diagnosis not present

## 2019-08-30 ENCOUNTER — Other Ambulatory Visit: Payer: Self-pay | Admitting: Emergency Medicine

## 2019-08-30 DIAGNOSIS — J069 Acute upper respiratory infection, unspecified: Secondary | ICD-10-CM | POA: Diagnosis not present

## 2019-08-30 DIAGNOSIS — Z20822 Contact with and (suspected) exposure to covid-19: Secondary | ICD-10-CM

## 2019-08-30 DIAGNOSIS — R05 Cough: Secondary | ICD-10-CM | POA: Diagnosis not present

## 2019-09-01 LAB — NOVEL CORONAVIRUS, NAA: SARS-CoV-2, NAA: NOT DETECTED

## 2019-09-13 DIAGNOSIS — E669 Obesity, unspecified: Secondary | ICD-10-CM | POA: Diagnosis not present

## 2019-09-13 DIAGNOSIS — Z01411 Encounter for gynecological examination (general) (routine) with abnormal findings: Secondary | ICD-10-CM | POA: Diagnosis not present

## 2019-09-13 DIAGNOSIS — N760 Acute vaginitis: Secondary | ICD-10-CM | POA: Diagnosis not present

## 2019-09-13 DIAGNOSIS — Z113 Encounter for screening for infections with a predominantly sexual mode of transmission: Secondary | ICD-10-CM | POA: Diagnosis not present

## 2019-09-13 DIAGNOSIS — R6889 Other general symptoms and signs: Secondary | ICD-10-CM | POA: Diagnosis not present

## 2019-09-21 DIAGNOSIS — R6889 Other general symptoms and signs: Secondary | ICD-10-CM | POA: Diagnosis not present

## 2019-09-21 DIAGNOSIS — E669 Obesity, unspecified: Secondary | ICD-10-CM | POA: Diagnosis not present

## 2019-09-21 DIAGNOSIS — Z13220 Encounter for screening for lipoid disorders: Secondary | ICD-10-CM | POA: Diagnosis not present

## 2019-12-25 ENCOUNTER — Other Ambulatory Visit: Payer: Self-pay

## 2019-12-25 DIAGNOSIS — G4489 Other headache syndrome: Secondary | ICD-10-CM | POA: Diagnosis not present

## 2019-12-25 DIAGNOSIS — R05 Cough: Secondary | ICD-10-CM | POA: Diagnosis not present

## 2019-12-25 DIAGNOSIS — Z20822 Contact with and (suspected) exposure to covid-19: Secondary | ICD-10-CM | POA: Diagnosis not present

## 2019-12-25 DIAGNOSIS — J029 Acute pharyngitis, unspecified: Secondary | ICD-10-CM | POA: Diagnosis not present

## 2020-02-09 DIAGNOSIS — Z113 Encounter for screening for infections with a predominantly sexual mode of transmission: Secondary | ICD-10-CM | POA: Diagnosis not present

## 2020-02-09 DIAGNOSIS — F3281 Premenstrual dysphoric disorder: Secondary | ICD-10-CM | POA: Diagnosis not present

## 2020-02-15 DIAGNOSIS — J029 Acute pharyngitis, unspecified: Secondary | ICD-10-CM | POA: Diagnosis not present

## 2020-02-15 DIAGNOSIS — Z20822 Contact with and (suspected) exposure to covid-19: Secondary | ICD-10-CM | POA: Diagnosis not present

## 2020-02-15 DIAGNOSIS — B349 Viral infection, unspecified: Secondary | ICD-10-CM | POA: Diagnosis not present

## 2020-03-22 DIAGNOSIS — K50919 Crohn's disease, unspecified, with unspecified complications: Secondary | ICD-10-CM | POA: Diagnosis not present

## 2020-03-22 DIAGNOSIS — Z6830 Body mass index (BMI) 30.0-30.9, adult: Secondary | ICD-10-CM | POA: Diagnosis not present

## 2020-03-22 DIAGNOSIS — R197 Diarrhea, unspecified: Secondary | ICD-10-CM | POA: Diagnosis not present

## 2020-03-22 DIAGNOSIS — F3281 Premenstrual dysphoric disorder: Secondary | ICD-10-CM | POA: Diagnosis not present

## 2020-03-27 DIAGNOSIS — K50919 Crohn's disease, unspecified, with unspecified complications: Secondary | ICD-10-CM | POA: Diagnosis not present

## 2020-03-27 DIAGNOSIS — R197 Diarrhea, unspecified: Secondary | ICD-10-CM | POA: Diagnosis not present

## 2020-04-12 DIAGNOSIS — Z20822 Contact with and (suspected) exposure to covid-19: Secondary | ICD-10-CM | POA: Diagnosis not present

## 2020-04-12 DIAGNOSIS — Z01812 Encounter for preprocedural laboratory examination: Secondary | ICD-10-CM | POA: Diagnosis not present

## 2020-04-15 DIAGNOSIS — K219 Gastro-esophageal reflux disease without esophagitis: Secondary | ICD-10-CM | POA: Diagnosis not present

## 2020-04-15 DIAGNOSIS — K633 Ulcer of intestine: Secondary | ICD-10-CM | POA: Diagnosis not present

## 2020-04-15 DIAGNOSIS — K50018 Crohn's disease of small intestine with other complication: Secondary | ICD-10-CM | POA: Diagnosis not present

## 2020-04-15 DIAGNOSIS — K5000 Crohn's disease of small intestine without complications: Secondary | ICD-10-CM | POA: Diagnosis not present

## 2020-04-15 DIAGNOSIS — Z980 Intestinal bypass and anastomosis status: Secondary | ICD-10-CM | POA: Diagnosis not present

## 2020-04-15 DIAGNOSIS — K529 Noninfective gastroenteritis and colitis, unspecified: Secondary | ICD-10-CM | POA: Diagnosis not present

## 2020-04-15 DIAGNOSIS — Z9049 Acquired absence of other specified parts of digestive tract: Secondary | ICD-10-CM | POA: Diagnosis not present

## 2020-06-17 DIAGNOSIS — Z113 Encounter for screening for infections with a predominantly sexual mode of transmission: Secondary | ICD-10-CM | POA: Diagnosis not present

## 2020-06-17 DIAGNOSIS — Z01419 Encounter for gynecological examination (general) (routine) without abnormal findings: Secondary | ICD-10-CM | POA: Diagnosis not present

## 2020-06-17 DIAGNOSIS — Z124 Encounter for screening for malignant neoplasm of cervix: Secondary | ICD-10-CM | POA: Diagnosis not present

## 2020-06-17 DIAGNOSIS — Z6831 Body mass index (BMI) 31.0-31.9, adult: Secondary | ICD-10-CM | POA: Diagnosis not present

## 2020-06-18 DIAGNOSIS — H66009 Acute suppurative otitis media without spontaneous rupture of ear drum, unspecified ear: Secondary | ICD-10-CM | POA: Diagnosis not present

## 2020-06-18 DIAGNOSIS — Z1152 Encounter for screening for COVID-19: Secondary | ICD-10-CM | POA: Diagnosis not present

## 2020-06-18 DIAGNOSIS — J019 Acute sinusitis, unspecified: Secondary | ICD-10-CM | POA: Diagnosis not present

## 2020-07-12 DIAGNOSIS — Z8619 Personal history of other infectious and parasitic diseases: Secondary | ICD-10-CM | POA: Diagnosis not present

## 2020-08-06 DIAGNOSIS — Z8619 Personal history of other infectious and parasitic diseases: Secondary | ICD-10-CM | POA: Diagnosis not present

## 2020-09-09 DIAGNOSIS — N898 Other specified noninflammatory disorders of vagina: Secondary | ICD-10-CM | POA: Diagnosis not present

## 2020-09-09 DIAGNOSIS — N946 Dysmenorrhea, unspecified: Secondary | ICD-10-CM | POA: Diagnosis not present

## 2020-09-09 DIAGNOSIS — Z113 Encounter for screening for infections with a predominantly sexual mode of transmission: Secondary | ICD-10-CM | POA: Diagnosis not present

## 2020-09-09 DIAGNOSIS — D84821 Immunodeficiency due to drugs: Secondary | ICD-10-CM | POA: Diagnosis not present

## 2024-03-31 ENCOUNTER — Ambulatory Visit
Admission: RE | Admit: 2024-03-31 | Discharge: 2024-03-31 | Disposition: A | Discharge status: Home / Self Care | Source: Ambulatory Visit | Attending: Family Medicine | Admitting: Family Medicine

## 2024-03-31 VITALS — BP 127/85 | HR 91 | Temp 98.6°F | Resp 18

## 2024-03-31 DIAGNOSIS — J029 Acute pharyngitis, unspecified: Secondary | ICD-10-CM | POA: Diagnosis present

## 2024-03-31 LAB — POCT RAPID STREP A (OFFICE): Rapid Strep A Screen: NEGATIVE

## 2024-03-31 MED ORDER — AMOXICILLIN-POT CLAVULANATE 875-125 MG PO TABS: 1.0000 | ORAL_TABLET | Freq: Two times a day (BID) | ORAL | 0 refills | Status: AC

## 2024-03-31 NOTE — ED Triage Notes (Signed)
 Pt c/o  sore throat,body aches x3 days.Taking dayquil, nyquil and chloraspetic spray.

## 2024-03-31 NOTE — ED Provider Notes (Signed)
 [facility]  Note:  This document was prepared using Conservation officer, historic buildings and may include unintentional dictation errors.  MRN: [id number] DOB: 10-29-1991  Subjective:   Sarah Morrison is a 33 y.o. female presenting for 3 day history of throat pain, painful swallowing, body aches, subjective fever. No sinus symptoms, ear pain, cough, chest pain, shob, wheezing, rashes. She does have unprotected oral sex and is not opposed to STI testing.   No chronic medications.    No Known Allergies  Past Medical History:  Diagnosis Date   Dermoid cyst of ovary, 6.1cm by CT [id number] 10/11/2012   Gastritis    Ileitis, probable Crohn's disease 10/11/2012   CT 10/11/2012: Marked inflammatory findings in the terminal ileum and adjacent  cecum, with enlarged pericecal lymph nodes. The appearance favors  Crohn's disease/terminal ileitis, with infectious enterocolitis  less likely given the segmental involvement. No definite  extraluminal gas although there is mild complex ascites in the  pelvis.       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   LAPAROTOMY N/A 06/16/2013   Procedure: EXPLORATORY LAPAROTOMY With Removal of Left Ovarian Dermoid Cyst;  Surgeon: Johnn Najjar, MD;  Location: [facility] ORS;  Service: Gynecology;  Laterality: N/A;    Family History  Adopted: Yes  Problem Relation Age of Onset   Colon cancer Neg Hx     Social History   Tobacco Use   Smoking status: Never   Smokeless tobacco: Never  Substance Use Topics   Alcohol use: Yes    Comment: occasionally   Drug use: No    ROS   Objective:   Vitals: BP 127/85 (BP Location: Right Arm)   Pulse 91   Temp 98.6 F (37  C) (Oral)   Resp 18   LMP 03/23/2024 (Exact Date)   SpO2 94%   Physical Exam Constitutional:      General: She is not in acute distress.    Appearance: Normal appearance. She is well-developed. She is not ill-appearing, toxic-appearing or diaphoretic.  HENT:     Head: Normocephalic and atraumatic.     Nose: Nose normal.     Mouth/Throat:     Mouth: Mucous membranes are moist.     Pharynx: Pharyngeal swelling, oropharyngeal exudate and posterior oropharyngeal erythema present. No uvula swelling.     Tonsils: Tonsillar exudate present. No tonsillar abscesses. 1+ on the right. 0 on the left.  Eyes:     General: No scleral icterus.       Right eye: No discharge.        Left eye: No discharge.     Extraocular Movements: Extraocular movements intact.  Cardiovascular:     Rate and Rhythm: Normal rate.  Pulmonary:     Effort: Pulmonary effort is normal.  Skin:    General: Skin is warm and dry.  Neurological:     General: No focal deficit present.     Mental Status: She is alert and oriented to person, place, and time.  Psychiatric:        Mood and Affect: Mood normal.        Behavior: Behavior normal.    Rapid strep results were negative.   Assessment and Plan :  PDMP not reviewed this encounter.  1. Acute pharyngitis, unspecified etiology    Will treat empirically with Augmentin based on physical exam findings.  Oral cytology pending.  Recommend supportive care otherwise.  Counseled patient on potential for adverse effects with medications prescribed/recommended today, ER and return-to-clinic precautions discussed, patient verbalized understanding.    Adolph Hoop, [place] 03/31/24 [phone]

## 2024-04-03 LAB — CYTOLOGY, (ORAL, ANAL, URETHRAL) ANCILLARY ONLY
Chlamydia: NEGATIVE
Comment: NEGATIVE
Comment: NORMAL
Neisseria Gonorrhea: NEGATIVE
